# Patient Record
Sex: Female | Born: 1985 | Race: White | Hispanic: Yes | Marital: Single | Smoking: Never smoker
Health system: Southern US, Community
[De-identification: ages and names within clinical notes are randomized; demographics above are authoritative.]

## PROBLEM LIST (undated history)

## (undated) DIAGNOSIS — T7840XA Allergy, unspecified, initial encounter: Secondary | ICD-10-CM

## (undated) DIAGNOSIS — N921 Excessive and frequent menstruation with irregular cycle: Secondary | ICD-10-CM

## (undated) DIAGNOSIS — F329 Major depressive disorder, single episode, unspecified: Secondary | ICD-10-CM

## (undated) DIAGNOSIS — K589 Irritable bowel syndrome without diarrhea: Secondary | ICD-10-CM

## (undated) DIAGNOSIS — D649 Anemia, unspecified: Secondary | ICD-10-CM

## (undated) DIAGNOSIS — Z803 Family history of malignant neoplasm of breast: Secondary | ICD-10-CM

## (undated) DIAGNOSIS — K219 Gastro-esophageal reflux disease without esophagitis: Secondary | ICD-10-CM

## (undated) DIAGNOSIS — F4310 Post-traumatic stress disorder, unspecified: Secondary | ICD-10-CM

## (undated) DIAGNOSIS — F419 Anxiety disorder, unspecified: Secondary | ICD-10-CM

## (undated) DIAGNOSIS — F32A Depression, unspecified: Secondary | ICD-10-CM

## (undated) DIAGNOSIS — I10 Essential (primary) hypertension: Secondary | ICD-10-CM

## (undated) HISTORY — DX: Anemia, unspecified: D64.9

## (undated) HISTORY — DX: Allergy, unspecified, initial encounter: T78.40XA

## (undated) HISTORY — DX: Post-traumatic stress disorder, unspecified: F43.10

## (undated) HISTORY — DX: Excessive and frequent menstruation with irregular cycle: N92.1

## (undated) HISTORY — DX: Family history of malignant neoplasm of breast: Z80.3

## (undated) HISTORY — DX: Anxiety disorder, unspecified: F41.9

## (undated) HISTORY — DX: Depression, unspecified: F32.A

## (undated) HISTORY — DX: Major depressive disorder, single episode, unspecified: F32.9

## (undated) HISTORY — PX: NO PAST SURGERIES: SHX2092

## (undated) HISTORY — DX: Gastro-esophageal reflux disease without esophagitis: K21.9

## (undated) HISTORY — DX: Essential (primary) hypertension: I10

---
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
## 2004-03-31 ENCOUNTER — Emergency Department (HOSPITAL_COMMUNITY): Admission: EM | Admit: 2004-03-31 | Discharge: 2004-03-31 | Payer: Self-pay | Admitting: Emergency Medicine

## 2004-04-09 ENCOUNTER — Ambulatory Visit: Payer: Self-pay | Admitting: Internal Medicine

## 2004-04-29 ENCOUNTER — Ambulatory Visit: Payer: Self-pay | Admitting: Internal Medicine

## 2004-07-08 ENCOUNTER — Ambulatory Visit: Payer: Self-pay | Admitting: Internal Medicine

## 2004-09-10 ENCOUNTER — Ambulatory Visit (HOSPITAL_COMMUNITY): Admission: RE | Admit: 2004-09-10 | Discharge: 2004-09-10 | Payer: Self-pay | Admitting: Obstetrics and Gynecology

## 2005-02-11 ENCOUNTER — Inpatient Hospital Stay (HOSPITAL_COMMUNITY): Admission: AD | Admit: 2005-02-11 | Discharge: 2005-02-13 | Payer: Self-pay | Admitting: Obstetrics and Gynecology

## 2005-03-27 ENCOUNTER — Other Ambulatory Visit: Admission: RE | Admit: 2005-03-27 | Discharge: 2005-03-27 | Payer: Self-pay | Admitting: Obstetrics and Gynecology

## 2006-02-25 ENCOUNTER — Other Ambulatory Visit: Admission: RE | Admit: 2006-02-25 | Discharge: 2006-02-25 | Payer: Self-pay | Admitting: Obstetrics and Gynecology

## 2006-07-31 ENCOUNTER — Emergency Department (HOSPITAL_COMMUNITY): Admission: EM | Admit: 2006-07-31 | Discharge: 2006-07-31 | Payer: Self-pay | Admitting: Emergency Medicine

## 2007-04-27 ENCOUNTER — Emergency Department (HOSPITAL_COMMUNITY): Admission: EM | Admit: 2007-04-27 | Discharge: 2007-04-27 | Payer: Self-pay | Admitting: Emergency Medicine

## 2007-06-09 ENCOUNTER — Emergency Department (HOSPITAL_COMMUNITY): Admission: EM | Admit: 2007-06-09 | Discharge: 2007-06-09 | Payer: Self-pay | Admitting: Emergency Medicine

## 2007-07-20 ENCOUNTER — Ambulatory Visit: Payer: Self-pay | Admitting: Gastroenterology

## 2007-07-20 DIAGNOSIS — R1011 Right upper quadrant pain: Secondary | ICD-10-CM | POA: Insufficient documentation

## 2008-02-26 ENCOUNTER — Emergency Department (HOSPITAL_COMMUNITY): Admission: EM | Admit: 2008-02-26 | Discharge: 2008-02-26 | Payer: Self-pay | Admitting: Emergency Medicine

## 2008-09-12 ENCOUNTER — Ambulatory Visit: Payer: Self-pay | Admitting: Gastroenterology

## 2008-09-12 DIAGNOSIS — K3189 Other diseases of stomach and duodenum: Secondary | ICD-10-CM | POA: Insufficient documentation

## 2008-09-12 DIAGNOSIS — R1013 Epigastric pain: Secondary | ICD-10-CM

## 2008-09-18 ENCOUNTER — Ambulatory Visit: Payer: Self-pay | Admitting: Gastroenterology

## 2008-10-23 ENCOUNTER — Ambulatory Visit: Payer: Self-pay | Admitting: Gastroenterology

## 2008-10-23 DIAGNOSIS — R110 Nausea: Secondary | ICD-10-CM | POA: Insufficient documentation

## 2008-11-09 ENCOUNTER — Encounter: Admission: RE | Admit: 2008-11-09 | Discharge: 2008-11-09 | Payer: Self-pay | Admitting: Family Medicine

## 2008-11-27 ENCOUNTER — Ambulatory Visit (HOSPITAL_COMMUNITY): Admission: RE | Admit: 2008-11-27 | Discharge: 2008-11-27 | Payer: Self-pay | Admitting: Gastroenterology

## 2009-07-29 ENCOUNTER — Encounter: Admission: RE | Admit: 2009-07-29 | Discharge: 2009-07-29 | Payer: Self-pay | Admitting: Family Medicine

## 2010-09-09 NOTE — Assessment & Plan Note (Signed)
[facility]                         GASTROENTEROLOGY OFFICE NOTE   TENIOLA, TSENG                         MRN:          [id number]  DATE:07/20/2007                            DOB:          09-Feb-1986    REFERRING PHYSICIAN:  Clyda Greener, Dr.   Jaquita Rector FOR CONSULTATION:  Abdominal pain.   Ms. Toda is a 25 year old female, referred through the courtesy of Dr.  Bruna Potter for evaluation.  On two occasions over the past three months, she  has had episodes of severe right upper quadrant pain.  The pain has  radiated to her back and lasted several hours.  She was seen twice in  the emergency room, where workup, including ultrasound and blood work,  including LFTs, were normal.  The pain subsided spontaneously.  In  between, she has felt well, except for episodes of diarrhea and nausea,  which she attributes to her IBS.  She tends to fluctuate between  constipation and diarrhea.  At times, she will have nausea, associated  with her diarrhea.  She does take minocycline at any time during the day  for acne.  She is on no gastric irritants, including nonsteroidals.  She  apparently underwent upper endoscopy and colonoscopy in 2007 that were  normal.   PAST MEDICAL HISTORY:  Pertinent for depression.   FAMILY HISTORY:  Noncontributory.   MEDICATIONS:  Include minocycline and Loestrin.   She has no allergies.   She neither smokes nor drinks.  She is single.   REVIEW OF SYSTEMS:  Positive for back pain and menstrual cramps.   PHYSICAL EXAMINATION:  Pulse 60, blood pressure 104/58, weight 142.  HEENT: EOMI.  PERRLA.  Sclerae are anicteric.  Conjunctivae are pink.  NECK:  Supple without thyromegaly, adenopathy or carotid bruits.  CHEST:  Clear to auscultation and percussion without adventitious  sounds.  CARDIAC:  Regular rhythm; normal S1 S2.  There are no murmurs, gallops  or rubs.  ABDOMEN:  Bowel sounds are normoactive.  Abdomen is soft, nontender  and  nondistended.  There are no abdominal masses, tenderness, splenic  enlargement or hepatomegaly.  EXTREMITIES:  Full range of motion.  No cyanosis, clubbing or edema.  RECTAL:  Deferred.   IMPRESSION:  1. Intermittent right upper quadrant pain.  There is no evidence for      biliary tract disease.  This may be due to a reaction to      minocycline, particularly when she takes it on an empty stomach.      This would be an unusual presentation of reactive peptic disease.      I do not think this pain is from her IBS.  2. Irritable bowel syndrome/alternating constipation and diarrhea.   RECOMMENDATION:  1. Fiber supplementation.  2. NuLev 0.25 mg p.r.n. pain.     Barbette Hair. Arlyce Dice, MD,FACG  Electronically Signed    RDK/MedQ  DD: 07/20/2007  DT: 07/20/2007  Job #: [id number]   cc:   Clyda Greener, MD

## 2010-09-09 NOTE — Letter (Signed)
July 20, 2007    Dr. Clyda Greener  [address]  [place], [place] [address]   RE:  Patricia Whitehead, Patricia Whitehead  MRN:  [id number]  /  DOB:  1985/07/28   Dear Dr. Bruna Potter:   Upon your kind referral, I had the pleasure of evaluating your patient  and I am pleased to offer my findings.  I saw Patricia Whitehead in the office  today.  Enclosed is a copy of my progress note that details my findings  and recommendations.   Thank you for the opportunity to participate in your patient's care.    Sincerely,      Barbette Hair. Arlyce Dice, MD,FACG  Electronically Signed    RDK/MedQ  DD: 07/20/2007  DT: 07/20/2007  Job #: [id number]

## 2010-09-09 NOTE — Letter (Signed)
July 20, 2007    Ms. Patricia Whitehead   RE:  Patricia Whitehead, Patricia Whitehead  MRN:  [id number]  /  DOB:  1986-03-04   Dear Ms. Sami:   It is my pleasure to have treated you recently as a new patient in my  office.  I appreciate your confidence and the opportunity to participate  in your care.   Since I do have a busy inpatient endoscopy schedule and office schedule,  my office hours vary weekly.  I am, however, available for emergency  calls every day through my office.  If I cannot promptly meet an urgent  office appointment, another one of our gastroenterologists will be able  to assist you.   My well-trained staff are prepared to help you at all times.  For  emergencies after office hours, a physician from our gastroenterology  section is always available through my 24-hour answering service.   While you are under my care, I encourage discussion of your questions  and concerns, and I will be happy to return your calls as soon as I am  available.   Once again, I welcome you as a new patient and I look forward to a happy  and healthy relationship.    Sincerely,      Barbette Hair. Arlyce Dice, MD,FACG  Electronically Signed   RDK/MedQ  DD: 07/20/2007  DT: 07/20/2007  Job #: [id number]

## 2010-09-12 NOTE — H&P (Signed)
NAME:  Patricia Whitehead, Patricia Whitehead                ACCOUNT NO.:  [id number]   MEDICAL RECORD NO.:  [id number]          PATIENT TYPE:  MAT   LOCATION:  [facility]                          FACILITY:  [facility]   PHYSICIAN:  Crist Fat. Rivard, M.D. DATE OF BIRTH:  [birth year]   DATE OF ADMISSION:  02/11/2005  DATE OF DISCHARGE:                                HISTORY & PHYSICAL   Patricia Whitehead is a 25 year old, gravida 1, para 0, at 39-5/7th weeks, EDD  February 13, 2005, who presents with contractions becoming stronger and more  regular since 0700 this morning.  She reports positive fetal movement, no  vaginal bleeding, no rupture of membranes.  She denies any headache, visual  changes, or epigastric pain.   Her pregnancy has been followed by the C.N.M. service at [facility] and is  remarkable for:  1.  First trimester spotting.  2.  Depression.  3.  Group B strep negative.   This patient began prenatal care at the office of [facility], on July 18, 2004,  at approximately 10 weeks' gestation.  EDC determined by dates and confirmed  with ultrasound.  Her pregnancy has been essentially unremarkable.  She had  some nausea and vomiting relieved with Phenergan early in pregnancy.  She  had a small buttock abscess that was drained and treated with antibiotics  effectively at approximately 26/27 weeks pregnancy.  The remainder of her  pregnancy has been normal.  She has been size equal to dates throughout,  normotensive with no proteinuria.   PRENATAL LABORATORY DATA:  On July 18, 2004, hemoglobin and hematocrit 13.4  and 39.4, platelets 337,000.  Blood type and rhythm O positive.  Antibody  screen negative.  Sickle cell trait negative.  VDRL nonreactive.  Rubella  immune.  Hepatitis B surface antigen negative.  HIV nonreactive.  Pap smear  within normal limits.  GC and Chlamydia negative.  CF testing negative.  Quad screen within normal limits.  At 28 weeks, one-hour glucose challenge  within normal limits.  Hemoglobin 10.8.  At  36 weeks, culture of the vaginal  tract is negative for Group B strep, GC, and Chlamydia.   The patient has no known drug allergies.   She denies the use of tobacco, alcohol, or illicit drugs.   OBSTETRICAL HISTORY:  The current pregnancy.   MEDICAL HISTORY:  Significant for depression.   FAMILY HISTORY:  Maternal grandmother, paternal grandfather, maternal  grandfather and paternal grandmother with chronic hypertension.  The  patient's mother with a history of varicose veins.  The patient reports  ___________ in several family members but does not name those family  members.   GENETIC HISTORY:  There is no family history of familial or chromosomal  disorders, children that died in infancy, or that were born with birth  defects.   REVIEW OF SYSTEMS:  There are no signs or symptoms suggestive of focal or  systemic disease.  The patient is 43 and 5 weeks' gestation.  She presents  in early labor.  BPP is 6 out of 8.   PHYSICAL EXAMINATION:  VITAL SIGNS:  Stable.  The  patient is afebrile.  HEENT:  Unremarkable.  HEART:  Regular.  ABDOMEN:  Gravid in its contour.  Uterine fundus is noted to extend 39-cm  above the level of the pubic symphysis.  Leopold maneuvers, cephalic  presentation and the estimated fetal weight is 7.5 to 8 pounds.  Baseline of  the fetal heart rate monitor is 140s to 150s with average long term  variability.  Accelerations are noted with several mild variable  decelerations.  The strip is reassuring but nonreactive.  BPP was obtained  and finds to have oligohydramnios with AFI 0.6-cm.  EXTREMITIES:  Show no pathologic edema.  DTRs are 1+ with no clonus.  There  is no calf tenderness bilaterally.  PELVIC:  Digital exam of the cervix finds it to be 3-cm, dilated, 90%  effaced, with a cephalic presenting part at a -1 station.   ASSESSMENT:  1.  Intrauterine pregnancy at term.  2.  Early labor.  3.  Oligohydramnios.   1.  Admit per Dr. Dois Davenport Rivard.  2.   Routine C.N.M. orders.  3.  Start Pitocin per load dose protocol.  4.  Amnio infusion as needed for variable decelerations.      Rica Koyanagi, C.N.M.      Crist Fat Rivard, M.D.  Electronically Signed    SDM/MEDQ  D:  02/11/2005  T:  02/11/2005  Job:  [id number]

## 2011-01-16 LAB — CBC
HCT: 38.4
Hemoglobin: 13.5
MCHC: 35.2
MCV: 91.3
Platelets: 258
RBC: 4.21
RDW: 12.8
WBC: 7.6

## 2011-01-16 LAB — DIFFERENTIAL
Basophils Absolute: 0
Basophils Relative: 0
Eosinophils Absolute: 0
Eosinophils Relative: 0
Lymphocytes Relative: 10 — ABNORMAL LOW
Lymphs Abs: 0.7
Monocytes Absolute: 0.7
Monocytes Relative: 10
Neutro Abs: 6
Neutrophils Relative %: 80 — ABNORMAL HIGH

## 2011-01-16 LAB — COMPREHENSIVE METABOLIC PANEL
ALT: 16
AST: 17
Albumin: 4.4
Alkaline Phosphatase: 69
BUN: 7
CO2: 29
Calcium: 9.8
Chloride: 103
Creatinine, Ser: 0.62
GFR calc Af Amer: >60
GFR calc non Af Amer: >60
Glucose, Bld: 96
Potassium: 3.5
Sodium: 140
Total Bilirubin: 0.6
Total Protein: 8

## 2011-01-16 LAB — URINALYSIS, ROUTINE W REFLEX MICROSCOPIC
Bilirubin Urine: NEGATIVE
Glucose, UA: NEGATIVE
Ketones, ur: NEGATIVE
Leukocytes, UA: NEGATIVE
Nitrite: NEGATIVE
Protein, ur: NEGATIVE
Specific Gravity, Urine: 1.009
Urobilinogen, UA: 0.2
pH: 7

## 2011-01-16 LAB — URINE MICROSCOPIC-ADD ON

## 2011-01-16 LAB — PREGNANCY, URINE: Preg Test, Ur: NEGATIVE

## 2011-01-16 LAB — LIPASE, BLOOD: Lipase: 22

## 2011-01-27 LAB — POCT I-STAT, CHEM 8
BUN: 13
Calcium, Ion: 1.2
Chloride: 108
Creatinine, Ser: 0.7
Glucose, Bld: 97
HCT: 37
Hemoglobin: 12.6
Potassium: 3.2 — ABNORMAL LOW
Sodium: 142
TCO2: 24

## 2011-01-30 LAB — COMPREHENSIVE METABOLIC PANEL
ALT: 23
AST: 28
Albumin: 3.9
Alkaline Phosphatase: 62
BUN: 19
CO2: 27
Calcium: 9.4
Chloride: 105
Creatinine, Ser: 1.03
GFR calc Af Amer: >60
GFR calc non Af Amer: >60
Glucose, Bld: 117 — ABNORMAL HIGH
Potassium: 3.7
Sodium: 141
Total Bilirubin: 0.8
Total Protein: 7.7

## 2011-01-30 LAB — URINE MICROSCOPIC-ADD ON

## 2011-01-30 LAB — URINALYSIS, ROUTINE W REFLEX MICROSCOPIC
Bilirubin Urine: NEGATIVE
Glucose, UA: NEGATIVE
Hgb urine dipstick: NEGATIVE
Ketones, ur: 15 — AB
Nitrite: NEGATIVE
Protein, ur: 100 — AB
Specific Gravity, Urine: 1.037 — ABNORMAL HIGH
Urobilinogen, UA: 1
pH: 8.5 — ABNORMAL HIGH

## 2011-01-30 LAB — DIFFERENTIAL
Basophils Absolute: 0
Basophils Relative: 0
Eosinophils Absolute: 0
Eosinophils Relative: 0
Lymphocytes Relative: 6 — ABNORMAL LOW
Lymphs Abs: 1
Monocytes Absolute: 0.8
Monocytes Relative: 5
Neutro Abs: 14.7 — ABNORMAL HIGH
Neutrophils Relative %: 89 — ABNORMAL HIGH

## 2011-01-30 LAB — CBC
HCT: 38.2
Hemoglobin: 13.6
MCHC: 35.5
MCV: 89.7
Platelets: 268
RBC: 4.26
RDW: 12.5
WBC: 16.6 — ABNORMAL HIGH

## 2011-01-30 LAB — LIPASE, BLOOD: Lipase: 33

## 2011-11-04 ENCOUNTER — Emergency Department (HOSPITAL_COMMUNITY)
Admission: EM | Admit: 2011-11-04 | Discharge: 2011-11-04 | Disposition: A | Discharge status: Home / Self Care | Payer: Medicaid Other | Attending: Emergency Medicine | Admitting: Emergency Medicine

## 2011-11-04 ENCOUNTER — Encounter (HOSPITAL_COMMUNITY): Payer: Self-pay | Admitting: *Deleted

## 2011-11-04 DIAGNOSIS — T4275XA Adverse effect of unspecified antiepileptic and sedative-hypnotic drugs, initial encounter: Secondary | ICD-10-CM | POA: Insufficient documentation

## 2011-11-04 DIAGNOSIS — T7840XA Allergy, unspecified, initial encounter: Secondary | ICD-10-CM

## 2011-11-04 DIAGNOSIS — Z888 Allergy status to other drugs, medicaments and biological substances status: Secondary | ICD-10-CM | POA: Insufficient documentation

## 2011-11-04 LAB — URINALYSIS, ROUTINE W REFLEX MICROSCOPIC
Bilirubin Urine: NEGATIVE
Glucose, UA: NEGATIVE mg/dL
Hgb urine dipstick: NEGATIVE
Ketones, ur: NEGATIVE mg/dL
Nitrite: NEGATIVE
Protein, ur: NEGATIVE mg/dL
Specific Gravity, Urine: 1.011 (ref 1.005–1.030)
Urobilinogen, UA: 0.2 mg/dL (ref 0.0–1.0)
pH: 8 (ref 5.0–8.0)

## 2011-11-04 LAB — PREGNANCY, URINE: Preg Test, Ur: NEGATIVE

## 2011-11-04 LAB — URINE MICROSCOPIC-ADD ON

## 2011-11-04 LAB — RAPID URINE DRUG SCREEN, HOSP PERFORMED
Amphetamines: NOT DETECTED
Barbiturates: NOT DETECTED
Benzodiazepines: NOT DETECTED
Cocaine: NOT DETECTED
Opiates: NOT DETECTED
Tetrahydrocannabinol: NOT DETECTED

## 2011-11-04 MED ORDER — FAMOTIDINE IN NACL 20-0.9 MG/50ML-% IV SOLN
20.0000 mg | Freq: Once | INTRAVENOUS | Status: AC
  Administered 2011-11-04: 20 mg via INTRAVENOUS
  Filled 2011-11-04: qty 50

## 2011-11-04 MED ORDER — DIPHENHYDRAMINE HCL 50 MG/ML IJ SOLN
25.0000 mg | Freq: Once | INTRAMUSCULAR | Status: AC
  Administered 2011-11-04: 25 mg via INTRAVENOUS
  Filled 2011-11-04: qty 1

## 2011-11-04 MED ORDER — DIPHENHYDRAMINE HCL 25 MG PO TABS: 25.0000 mg | ORAL_TABLET | Freq: Two times a day (BID) | ORAL | Status: DC

## 2011-11-04 MED ORDER — ONDANSETRON HCL 4 MG/2ML IJ SOLN
4.0000 mg | Freq: Once | INTRAMUSCULAR | Status: AC
  Administered 2011-11-04: 4 mg via INTRAVENOUS
  Filled 2011-11-04: qty 2

## 2011-11-04 MED ORDER — SODIUM CHLORIDE 0.9 % IV SOLN
Freq: Once | INTRAVENOUS | Status: AC
  Administered 2011-11-04: 03:00:00 via INTRAVENOUS

## 2011-11-04 MED ORDER — METHYLPREDNISOLONE SODIUM SUCC 40 MG IJ SOLR
40.0000 mg | Freq: Once | INTRAMUSCULAR | Status: AC
  Administered 2011-11-04: 40 mg via INTRAVENOUS
  Filled 2011-11-04: qty 1

## 2011-11-04 MED ORDER — BENZTROPINE MESYLATE 1 MG/ML IJ SOLN
2.0000 mg | Freq: Once | INTRAMUSCULAR | Status: AC
  Administered 2011-11-04: 2 mg via INTRAVENOUS
  Filled 2011-11-04: qty 2

## 2011-11-04 NOTE — Discharge Instructions (Signed)
Please do not take another persons medication  Take Benadryl 25-50 milligram twice a day for the next 7 days

## 2011-11-04 NOTE — ED Notes (Signed)
Pt took med for migraine that belonged to her mom.  Took two hours ago and when she woke up she felt like her limbs were heavy and numb; states now she feels numb all over her face and chest.  Pt hyperventilating on arrival.  Topamax 25 mg

## 2011-11-04 NOTE — ED Provider Notes (Signed)
Pt presents with acute onset of stiffness and agitation after taking medication of mothers.  PE:  Pt has stiffness of hands and neck, agitated, uncomfortable.  No tachycardia, normal pusles, dry skin  Assessment:  Dystonic reaction to Topamax, treated with cogentin, benadryl, total improvement, educated on not taking other peoples prescriptions and treatment should her sx return.  Medical screening examination/treatment/procedure(s) were conducted as a shared visit with non-physician practitioner(s) and myself.  I personally evaluated the patient during the encounter    Vida Roller, MD 11/04/11 [phone]

## 2011-11-04 NOTE — ED Provider Notes (Signed)
History     CSN: [id number]  Arrival date & time 11/04/11  0254   None     Chief Complaint  Patient presents with  . Allergic Reaction    (Consider location/radiation/quality/duration/timing/severity/associated sxs/prior treatment) HPI Comments: Patient took Mother's Topamax about 30 minutes later developed generalized body cramping anxirty and an internal burning sensation   Patient is a 26 y.o. female presenting with allergic reaction. The history is provided by the patient.  Allergic Reaction The primary symptoms do not include wheezing, abdominal pain, nausea, vomiting, diarrhea, dizziness, palpitations, altered mental status, rash or urticaria. The current episode started 3 to 5 hours ago. The problem has not changed since onset.   History reviewed. No pertinent past medical history.  History reviewed. No pertinent past surgical history.  No family history on file.  History  Substance Use Topics  . Smoking status: Never Smoker   . Smokeless tobacco: Not on file  . Alcohol Use:     OB History    Grav Para Term Preterm Abortions TAB SAB Ect Mult Living                  Review of Systems  Constitutional: Negative for fever and chills.  Respiratory: Negative for wheezing.   Cardiovascular: Negative for palpitations.  Gastrointestinal: Negative for nausea, vomiting, abdominal pain and diarrhea.  Musculoskeletal: Negative for joint swelling.  Skin: Negative for pallor and rash.  Neurological: Positive for numbness. Negative for dizziness.  Psychiatric/Behavioral: Positive for agitation. Negative for altered mental status.    Allergies  Ketamine and Latex  Home Medications   Current Outpatient Rx  Name Route Sig Dispense Refill  . NORETHIN ACE-ETH ESTRAD-FE 1-20 MG-MCG PO TABS Oral Take 1 tablet by mouth daily.    Marland Kitchen DIPHENHYDRAMINE HCL 25 MG PO TABS Oral Take 1 tablet (25 mg total) by mouth 2 (two) times daily. 20 tablet 0    BP 145/75  Pulse 62  Temp  97.5 F (36.4 C)  Resp 18  SpO2 100%  LMP 10/21/2011  Physical Exam  Constitutional: She appears well-developed and well-nourished.  HENT:  Head: Normocephalic.       Having difficulty opening mouth   Neck: Normal range of motion.  Cardiovascular: Normal rate.   Pulmonary/Chest: Effort normal and breath sounds normal. No respiratory distress. She exhibits no tenderness.  Abdominal: Soft.  Musculoskeletal: She exhibits tenderness.       Cramping of fingers and feet   Neurological: She is alert.  Skin: Skin is warm and dry. No rash noted.    ED Course  Procedures (including critical care time)  Labs Reviewed  URINALYSIS, ROUTINE W REFLEX MICROSCOPIC - Abnormal; Notable for the following:    APPearance CLOUDY (*)     Leukocytes, UA TRACE (*)     All other components within normal limits  URINE MICROSCOPIC-ADD ON - Abnormal; Notable for the following:    Squamous Epithelial / LPF FEW (*)     Bacteria, UA MANY (*)     All other components within normal limits  PREGNANCY, URINE  URINE RAPID DRUG SCREEN (HOSP PERFORMED)   No results found.   1. Allergic reaction caused by a drug       MDM   distonic reaction  Given Benadryl and Cogentin  30 minutes after Cogentin patient feeling better able to flex fingers and toes       Arman Filter, NP 11/04/11 0457  Arman Filter, NP 11/04/11 [phone]

## 2011-11-04 NOTE — ED Notes (Signed)
Family states pt took her mother's topamax this evening and afterward started complaining her hands and feet became tingly then the pt states it moved to her chest  Pt's hand contracted upon examination  Pt wiggling around on stretcher states she is tingling all over  O2 applied, encouraged slow and steady breathing

## 2012-06-27 ENCOUNTER — Emergency Department (HOSPITAL_COMMUNITY)
Admission: EM | Admit: 2012-06-27 | Discharge: 2012-06-27 | Disposition: A | Discharge status: Home / Self Care | Payer: BC Managed Care – PPO | Attending: Emergency Medicine | Admitting: Emergency Medicine

## 2012-06-27 ENCOUNTER — Encounter (HOSPITAL_COMMUNITY): Payer: Self-pay | Admitting: *Deleted

## 2012-06-27 DIAGNOSIS — Z79899 Other long term (current) drug therapy: Secondary | ICD-10-CM | POA: Insufficient documentation

## 2012-06-27 DIAGNOSIS — N390 Urinary tract infection, site not specified: Secondary | ICD-10-CM | POA: Insufficient documentation

## 2012-06-27 DIAGNOSIS — Z87442 Personal history of urinary calculi: Secondary | ICD-10-CM | POA: Insufficient documentation

## 2012-06-27 DIAGNOSIS — R319 Hematuria, unspecified: Secondary | ICD-10-CM | POA: Insufficient documentation

## 2012-06-27 DIAGNOSIS — Z8719 Personal history of other diseases of the digestive system: Secondary | ICD-10-CM | POA: Insufficient documentation

## 2012-06-27 HISTORY — DX: Irritable bowel syndrome, unspecified: K58.9

## 2012-06-27 LAB — URINE MICROSCOPIC-ADD ON

## 2012-06-27 LAB — URINALYSIS, ROUTINE W REFLEX MICROSCOPIC
Bilirubin Urine: NEGATIVE
Glucose, UA: NEGATIVE mg/dL
Ketones, ur: NEGATIVE mg/dL
Nitrite: POSITIVE — AB
Protein, ur: 30 mg/dL — AB
Specific Gravity, Urine: 1.027 (ref 1.005–1.030)
Urobilinogen, UA: 1 mg/dL (ref 0.0–1.0)
pH: 8 (ref 5.0–8.0)

## 2012-06-27 LAB — POCT PREGNANCY, URINE: Preg Test, Ur: NEGATIVE

## 2012-06-27 MED ORDER — CEFTRIAXONE SODIUM 1 G IJ SOLR
1.0000 g | Freq: Once | INTRAMUSCULAR | Status: AC
  Administered 2012-06-27: 1 g via INTRAMUSCULAR
  Filled 2012-06-27: qty 10

## 2012-06-27 MED ORDER — OXYCODONE-ACETAMINOPHEN 5-325 MG PO TABS
1.0000 | ORAL_TABLET | Freq: Once | ORAL | Status: AC
  Administered 2012-06-27: 1 via ORAL
  Filled 2012-06-27: qty 1

## 2012-06-27 MED ORDER — HYDROCODONE-ACETAMINOPHEN 5-325 MG PO TABS: 1.0000 | ORAL_TABLET | ORAL | Status: DC | PRN

## 2012-06-27 MED ORDER — CEPHALEXIN 500 MG PO CAPS: 500.0000 mg | ORAL_CAPSULE | Freq: Four times a day (QID) | ORAL | Status: DC

## 2012-06-27 MED ORDER — PHENAZOPYRIDINE HCL 200 MG PO TABS: 200.0000 mg | ORAL_TABLET | Freq: Three times a day (TID) | ORAL | Status: DC

## 2012-06-27 MED ORDER — PHENAZOPYRIDINE HCL 200 MG PO TABS
200.0000 mg | ORAL_TABLET | Freq: Once | ORAL | Status: AC
  Administered 2012-06-27: 200 mg via ORAL
  Filled 2012-06-27: qty 1

## 2012-06-27 MED ORDER — FLUCONAZOLE 200 MG PO TABS: 200.0000 mg | ORAL_TABLET | Freq: Every day | ORAL | Status: AC

## 2012-06-27 MED ORDER — LIDOCAINE HCL 1 % IJ SOLN
INTRAMUSCULAR | Status: AC
  Administered 2012-06-27: 2.1 mL
  Filled 2012-06-27: qty 20

## 2012-06-27 NOTE — ED Notes (Signed)
Pt reports pelvic pain that began a few hours ago, pt also admits to urinary frequency and pressure - pt states she just finished her menstrual cycle yesterday and has noted some blood when urinating.

## 2012-06-27 NOTE — ED Provider Notes (Signed)
History     CSN: [id number]  Arrival date & time 06/27/12  2024   First MD Initiated Contact with Patient 06/27/12 2139      Chief Complaint  Patient presents with  . Abdominal Pain     The history is provided by the patient.   patient reports several hours of New Year's Mary hesitancy and pelvic pressure.  No vaginal complaints.  No recent antibiotics.  No fevers or chills.  No flank pain.  No nausea or vomiting.  She reports mild lower abdominal cramping and some hematuria.  No radiating flank pain.  History kidney stones.  Symptoms are mild to moderate in severity.  Nothing worsens or improves her symptoms  Past Medical History  Diagnosis Date  . IBS (irritable bowel syndrome)     History reviewed. No pertinent past surgical history.  History reviewed. No pertinent family history.  History  Substance Use Topics  . Smoking status: Never Smoker   . Smokeless tobacco: Not on file  . Alcohol Use: No    OB History   Grav Para Term Preterm Abortions TAB SAB Ect Mult Living                  Review of Systems  Gastrointestinal: Positive for abdominal pain.  All other systems reviewed and are negative.    Allergies  Ketamine and Latex  Home Medications   Current Outpatient Rx  Name  Route  Sig  Dispense  Refill  . ibuprofen (ADVIL,MOTRIN) 200 MG tablet   Oral   Take 400 mg by mouth once.         . norethindrone-ethinyl estradiol (JUNEL FE,GILDESS FE,LOESTRIN FE) 1-20 MG-MCG tablet   Oral   Take 1 tablet by mouth daily.         . phenazopyridine (AZO-DINE URINARY ANALGESIC) 97 MG tablet   Oral   Take 97 mg by mouth once.         . cephALEXin (KEFLEX) 500 MG capsule   Oral   Take 1 capsule (500 mg total) by mouth 4 (four) times daily.   28 capsule   0   . HYDROcodone-acetaminophen (NORCO/VICODIN) 5-325 MG per tablet   Oral   Take 1 tablet by mouth every 4 (four) hours as needed for pain.   15 tablet   0   . phenazopyridine (PYRIDIUM) 200 MG  tablet   Oral   Take 1 tablet (200 mg total) by mouth 3 (three) times daily.   6 tablet   0     BP 135/76  Pulse 70  Temp(Src) 97.9 F (36.6 C) (Oral)  Resp 19  SpO2 100%  LMP 06/22/2012  Physical Exam  Nursing note and vitals reviewed. Constitutional: She is oriented to person, place, and time. She appears well-developed and well-nourished. No distress.  HENT:  Head: Normocephalic and atraumatic.  Eyes: EOM are normal.  Neck: Normal range of motion.  Cardiovascular: Normal rate, regular rhythm and normal heart sounds.   Pulmonary/Chest: Effort normal and breath sounds normal.  Abdominal: Soft. She exhibits no distension. There is no tenderness.  Musculoskeletal: Normal range of motion.  Neurological: She is alert and oriented to person, place, and time.  Skin: Skin is warm and dry.  Psychiatric: She has a normal mood and affect. Judgment normal.    ED Course  Procedures (including critical care time)  Labs Reviewed  URINALYSIS, ROUTINE W REFLEX MICROSCOPIC - Abnormal; Notable for the following:    Color, Urine ORANGE (*)  APPearance CLOUDY (*)    Hgb urine dipstick LARGE (*)    Protein, ur 30 (*)    Nitrite POSITIVE (*)    Leukocytes, UA LARGE (*)    All other components within normal limits  URINE MICROSCOPIC-ADD ON - Abnormal; Notable for the following:    Bacteria, UA MANY (*)    All other components within normal limits  URINE CULTURE  POCT PREGNANCY, URINE   No results found.   1. Urinary tract infection       MDM  UTI without signs of pyelonephritis.  Peridium, pain medicine, IM Rocephin emergency apartment.  Home with antibiotics.  Understands return to the ER for new or worsening symptoms        Lyanne Co, MD 06/27/12 2233

## 2012-06-29 LAB — URINE CULTURE: Colony Count: 15000

## 2012-06-30 NOTE — ED Notes (Signed)
+   Urine Patient treated with Keflex-sensitive to same-chart appended per protocol MD. 

## 2012-07-29 ENCOUNTER — Encounter (HOSPITAL_COMMUNITY): Payer: Self-pay

## 2012-07-29 ENCOUNTER — Emergency Department (HOSPITAL_COMMUNITY)
Admission: EM | Admit: 2012-07-29 | Discharge: 2012-07-29 | Disposition: A | Discharge status: Home / Self Care | Payer: BC Managed Care – PPO | Attending: Emergency Medicine | Admitting: Emergency Medicine

## 2012-07-29 DIAGNOSIS — R350 Frequency of micturition: Secondary | ICD-10-CM | POA: Insufficient documentation

## 2012-07-29 DIAGNOSIS — K589 Irritable bowel syndrome without diarrhea: Secondary | ICD-10-CM | POA: Insufficient documentation

## 2012-07-29 DIAGNOSIS — Z79899 Other long term (current) drug therapy: Secondary | ICD-10-CM | POA: Insufficient documentation

## 2012-07-29 DIAGNOSIS — Z3202 Encounter for pregnancy test, result negative: Secondary | ICD-10-CM | POA: Insufficient documentation

## 2012-07-29 DIAGNOSIS — R300 Dysuria: Secondary | ICD-10-CM | POA: Insufficient documentation

## 2012-07-29 DIAGNOSIS — N390 Urinary tract infection, site not specified: Secondary | ICD-10-CM | POA: Insufficient documentation

## 2012-07-29 LAB — URINE MICROSCOPIC-ADD ON

## 2012-07-29 LAB — URINALYSIS, ROUTINE W REFLEX MICROSCOPIC
Glucose, UA: NEGATIVE mg/dL
Ketones, ur: 40 mg/dL — AB
Nitrite: POSITIVE — AB
Protein, ur: 100 mg/dL — AB
Specific Gravity, Urine: 1.026 (ref 1.005–1.030)
Urobilinogen, UA: >8 mg/dL — ABNORMAL HIGH (ref 0.0–1.0)
pH: 6 (ref 5.0–8.0)

## 2012-07-29 LAB — PREGNANCY, URINE: Preg Test, Ur: NEGATIVE

## 2012-07-29 MED ORDER — CEPHALEXIN 500 MG PO CAPS: 500.0000 mg | ORAL_CAPSULE | Freq: Four times a day (QID) | ORAL | Status: DC

## 2012-07-29 MED ORDER — HYDROCODONE-ACETAMINOPHEN 5-325 MG PO TABS
2.0000 | ORAL_TABLET | Freq: Once | ORAL | Status: AC
  Administered 2012-07-29: 2 via ORAL
  Filled 2012-07-29: qty 2

## 2012-07-29 MED ORDER — FLUCONAZOLE 150 MG PO TABS: 150.0000 mg | ORAL_TABLET | Freq: Once | ORAL | Status: DC

## 2012-07-29 MED ORDER — HYDROCODONE-ACETAMINOPHEN 5-325 MG PO TABS: 2.0000 | ORAL_TABLET | ORAL | Status: DC | PRN

## 2012-07-29 NOTE — ED Notes (Signed)
Per patient, she states that she was woken up by abdominal pain in the lower quadrants- felt like she needed to use the bathroom but only urinated a small amount- burning sensation during urination. Pt states that she took an OTC urinary pain relief which did not help. No blood in the urine noticed.

## 2012-07-29 NOTE — ED Provider Notes (Signed)
Medical screening examination/treatment/procedure(s) were performed by non-physician practitioner and as supervising physician I was immediately available for consultation/collaboration.  Ethelda Chick, MD 07/29/12 1034

## 2012-07-29 NOTE — ED Provider Notes (Signed)
History     CSN: [id number]  Arrival date & time 07/29/12  0908   First MD Initiated Contact with Patient 07/29/12 [phone]      Chief Complaint  Patient presents with  . Abdominal Pain  . Dysuria    (Consider location/radiation/quality/duration/timing/severity/associated sxs/prior treatment) Patient is a 27 y.o. female presenting with abdominal pain and dysuria. The history is provided by the patient. No language interpreter was used.  Abdominal Pain Pain location:  Generalized Pain quality: not aching   Pain radiates to:  Does not radiate Pain severity:  Moderate Onset quality:  Sudden Timing:  Constant Progression:  Worsening Chronicity:  New Relieved by:  Nothing Worsened by:  Nothing tried Ineffective treatments: urinary analgesic. Associated symptoms: dysuria   Dysuria  Associated symptoms include frequency.    Past Medical History  Diagnosis Date  . IBS (irritable bowel syndrome)     History reviewed. No pertinent past surgical history.  No family history on file.  History  Substance Use Topics  . Smoking status: Never Smoker   . Smokeless tobacco: Not on file  . Alcohol Use: No    OB History   Grav Para Term Preterm Abortions TAB SAB Ect Mult Living                  Review of Systems  Gastrointestinal: Positive for abdominal pain.  Genitourinary: Positive for dysuria and frequency.  All other systems reviewed and are negative.    Allergies  Ketamine and Latex  Home Medications   Current Outpatient Rx  Name  Route  Sig  Dispense  Refill  . norethindrone-ethinyl estradiol (MICROGESTIN) 1-20 MG-MCG tablet   Oral   Take 1 tablet by mouth at bedtime.           BP 143/98  Pulse 78  Temp(Src) 97.9 F (36.6 C) (Oral)  SpO2 99%  LMP 07/20/2012  Physical Exam  Nursing note and vitals reviewed. Constitutional: She is oriented to person, place, and time. She appears well-developed and well-nourished.  HENT:  Head: Normocephalic and  atraumatic.  Right Ear: External ear normal.  Left Ear: External ear normal.  Mouth/Throat: Oropharynx is clear and moist.  Eyes: Pupils are equal, round, and reactive to light.  Neck: Normal range of motion.  Cardiovascular: Normal rate and normal heart sounds.   Pulmonary/Chest: Effort normal.  Abdominal: Soft.  Musculoskeletal: Normal range of motion.  Neurological: She is alert and oriented to person, place, and time. She has normal reflexes.  Skin: Skin is warm.  Psychiatric: She has a normal mood and affect.    ED Course  Procedures (including critical care time)  Labs Reviewed  PREGNANCY, URINE  URINALYSIS, ROUTINE W REFLEX MICROSCOPIC   No results found.   No diagnosis found.    MDM  Urine shows tntc wbc. Rbc's and many bacteria   Pt given rx for keflex 500one po qid,   Hydrocodone for pain.    Pt advised to see her MD for recheck in 3-4 days    Elson Areas, [place] 07/29/12 1032

## 2012-07-31 LAB — URINE CULTURE: Colony Count: >=100000

## 2012-08-01 ENCOUNTER — Telehealth (HOSPITAL_COMMUNITY): Payer: Self-pay | Admitting: Emergency Medicine

## 2012-08-01 NOTE — ED Notes (Signed)
+  Urine. Patient treated with Keflex. Sensitive to same. Per protocol MD. °

## 2012-08-01 NOTE — ED Notes (Signed)
Patient has +Urine culture. °

## 2012-09-27 ENCOUNTER — Encounter (HOSPITAL_COMMUNITY): Payer: Self-pay | Admitting: *Deleted

## 2012-09-27 ENCOUNTER — Emergency Department (HOSPITAL_COMMUNITY)
Admission: EM | Admit: 2012-09-27 | Discharge: 2012-09-28 | Disposition: A | Discharge status: Home / Self Care | Payer: BC Managed Care – PPO | Attending: Emergency Medicine | Admitting: Emergency Medicine

## 2012-09-27 DIAGNOSIS — R350 Frequency of micturition: Secondary | ICD-10-CM | POA: Insufficient documentation

## 2012-09-27 DIAGNOSIS — Z87440 Personal history of urinary (tract) infections: Secondary | ICD-10-CM | POA: Insufficient documentation

## 2012-09-27 DIAGNOSIS — Z3202 Encounter for pregnancy test, result negative: Secondary | ICD-10-CM | POA: Insufficient documentation

## 2012-09-27 DIAGNOSIS — Z8719 Personal history of other diseases of the digestive system: Secondary | ICD-10-CM | POA: Insufficient documentation

## 2012-09-27 DIAGNOSIS — R300 Dysuria: Secondary | ICD-10-CM | POA: Insufficient documentation

## 2012-09-27 LAB — POCT PREGNANCY, URINE: Preg Test, Ur: NEGATIVE

## 2012-09-27 LAB — URINALYSIS, ROUTINE W REFLEX MICROSCOPIC
Bilirubin Urine: NEGATIVE
Glucose, UA: NEGATIVE mg/dL
Hgb urine dipstick: NEGATIVE
Ketones, ur: NEGATIVE mg/dL
Leukocytes, UA: NEGATIVE
Nitrite: NEGATIVE
Protein, ur: NEGATIVE mg/dL
Specific Gravity, Urine: 1.03 (ref 1.005–1.030)
Urobilinogen, UA: 1 mg/dL (ref 0.0–1.0)
pH: 7 (ref 5.0–8.0)

## 2012-09-27 NOTE — ED Notes (Signed)
Pt stated she has been off of the ABX for the past two weeks.

## 2012-09-27 NOTE — ED Notes (Signed)
PA student at bedside speaking to pt and family member.

## 2012-09-27 NOTE — ED Notes (Signed)
Pt in c/o pain with urination since yesterday

## 2012-09-28 LAB — GC/CHLAMYDIA PROBE AMP
CT Probe RNA: NEGATIVE
GC Probe RNA: NEGATIVE

## 2012-09-28 LAB — WET PREP, GENITAL
Trich, Wet Prep: NONE SEEN
Yeast Wet Prep HPF POC: NONE SEEN

## 2012-09-28 LAB — RPR: RPR Ser Ql: NONREACTIVE

## 2012-09-28 LAB — HIV ANTIBODY (ROUTINE TESTING W REFLEX): HIV: NONREACTIVE

## 2012-09-28 NOTE — ED Provider Notes (Signed)
History     CSN: [id number]  Arrival date & time 09/27/12  2131   First MD Initiated Contact with Patient 09/27/12 2309      Chief Complaint  Patient presents with  . Dysuria    (Consider location/radiation/quality/duration/timing/severity/associated sxs/prior treatment) HPI Comments: Patient with chronic dysuria, has appointment with Dr. Brunilda Payor in 3 weeks for a cyscopcy for further evaluation Finished antibiotics last week Also concerned about an STD as had a recent breakup with boyfriend.   Patient is a 27 y.o. female presenting with dysuria. The history is provided by the patient.  Dysuria Pain quality:  Unable to specify Onset quality:  Unable to specify Duration:  4 months Timing:  Constant Progression:  Unchanged Chronicity:  Chronic Recent urinary tract infections: yes   Relieved by:  Nothing Worsened by:  Nothing tried Ineffective treatments:  Antibiotics Associated symptoms: no abdominal pain, no fever, no flank pain, no genital lesions, no nausea, no vaginal discharge and no vomiting   Risk factors: recurrent urinary tract infections   Risk factors: no hx of pyelonephritis, no hx of urolithiasis and not pregnant     Past Medical History  Diagnosis Date  . IBS (irritable bowel syndrome)     History reviewed. No pertinent past surgical history.  History reviewed. No pertinent family history.  History  Substance Use Topics  . Smoking status: Never Smoker   . Smokeless tobacco: Not on file  . Alcohol Use: No    OB History   Grav Para Term Preterm Abortions TAB SAB Ect Mult Living                  Review of Systems  Constitutional: Negative for fever.  Gastrointestinal: Negative for nausea, vomiting, abdominal pain and diarrhea.  Genitourinary: Positive for dysuria and frequency. Negative for flank pain, decreased urine volume, vaginal discharge and vaginal pain.  Neurological: Negative for dizziness and numbness.  All other systems reviewed and are  negative.    Allergies  Ketamine and Latex  Home Medications   Current Outpatient Rx  Name  Route  Sig  Dispense  Refill  . norethindrone-ethinyl estradiol (MICROGESTIN) 1-20 MG-MCG tablet   Oral   Take 1 tablet by mouth at bedtime.           BP 150/79  Pulse 75  Temp(Src) 98.4 F (36.9 C) (Oral)  Resp 20  Ht 5' (1.524 m)  Wt 135 lb (61.236 kg)  BMI 26.37 kg/m2  SpO2 100%  Physical Exam  Constitutional: She appears well-developed and well-nourished.  HENT:  Head: Normocephalic.  Eyes: Pupils are equal, round, and reactive to light.  Neck: Normal range of motion.  Cardiovascular: Normal rate and regular rhythm.   Pulmonary/Chest: Effort normal.  Abdominal: Soft. Bowel sounds are normal.  Genitourinary: Vagina normal. Cervix exhibits no discharge. Right adnexum displays no tenderness. Left adnexum displays no tenderness. No vaginal discharge found.  Musculoskeletal: Normal range of motion.  Neurological: She is alert.  Skin: Skin is warm.    ED Course  Procedures (including critical care time)  Labs Reviewed  WET PREP, GENITAL - Abnormal; Notable for the following:    Clue Cells Wet Prep HPF POC RARE (*)    WBC, Wet Prep HPF POC MODERATE (*)    All other components within normal limits  URINALYSIS, ROUTINE W REFLEX MICROSCOPIC - Abnormal; Notable for the following:    APPearance CLOUDY (*)    All other components within normal limits  GC/CHLAMYDIA PROBE AMP  RPR  HIV ANTIBODY (ROUTINE TESTING)  POCT PREGNANCY, URINE   No results found.   1. Dysuria       MDM   Chronic dysuria patient will FU DR. Nesi         Arman Filter, NP 09/28/12 0145

## 2012-09-28 NOTE — ED Notes (Signed)
NP and PA student at bedside, completing pelvic exam

## 2012-09-29 NOTE — ED Provider Notes (Signed)
Medical screening examination/treatment/procedure(s) were performed by non-physician practitioner and as supervising physician I was immediately available for consultation/collaboration.  Kiarra Kidd, MD 09/29/12 0531 

## 2013-10-28 ENCOUNTER — Emergency Department (HOSPITAL_COMMUNITY)
Admission: EM | Admit: 2013-10-28 | Discharge: 2013-10-28 | Disposition: A | Discharge status: Home / Self Care | Payer: BC Managed Care – PPO | Attending: Emergency Medicine | Admitting: Emergency Medicine

## 2013-10-28 DIAGNOSIS — Z3202 Encounter for pregnancy test, result negative: Secondary | ICD-10-CM | POA: Insufficient documentation

## 2013-10-28 DIAGNOSIS — R1011 Right upper quadrant pain: Secondary | ICD-10-CM | POA: Insufficient documentation

## 2013-10-28 DIAGNOSIS — R1013 Epigastric pain: Secondary | ICD-10-CM | POA: Insufficient documentation

## 2013-10-28 DIAGNOSIS — Z91040 Latex allergy status: Secondary | ICD-10-CM | POA: Insufficient documentation

## 2013-10-28 DIAGNOSIS — Z8719 Personal history of other diseases of the digestive system: Secondary | ICD-10-CM | POA: Insufficient documentation

## 2013-10-28 DIAGNOSIS — R112 Nausea with vomiting, unspecified: Secondary | ICD-10-CM | POA: Insufficient documentation

## 2013-10-28 DIAGNOSIS — R1033 Periumbilical pain: Secondary | ICD-10-CM | POA: Insufficient documentation

## 2013-10-28 LAB — URINALYSIS, ROUTINE W REFLEX MICROSCOPIC
Bilirubin Urine: NEGATIVE
Glucose, UA: NEGATIVE mg/dL
Ketones, ur: NEGATIVE mg/dL
Nitrite: NEGATIVE
Protein, ur: NEGATIVE mg/dL
Specific Gravity, Urine: 1.019 (ref 1.005–1.030)
Urobilinogen, UA: 0.2 mg/dL (ref 0.0–1.0)
pH: 7.5 (ref 5.0–8.0)

## 2013-10-28 LAB — COMPREHENSIVE METABOLIC PANEL
ALT: 18 U/L (ref 0–35)
AST: 42 U/L — ABNORMAL HIGH (ref 0–37)
Albumin: 4.6 g/dL (ref 3.5–5.2)
Alkaline Phosphatase: 69 U/L (ref 39–117)
Anion gap: 14 (ref 5–15)
BUN: 13 mg/dL (ref 6–23)
CO2: 26 mEq/L (ref 19–32)
Calcium: 9.8 mg/dL (ref 8.4–10.5)
Chloride: 101 mEq/L (ref 96–112)
Creatinine, Ser: 0.61 mg/dL (ref 0.50–1.10)
GFR calc Af Amer: >90 mL/min (ref >90)
GFR calc non Af Amer: >90 mL/min (ref >90)
Glucose, Bld: 124 mg/dL — ABNORMAL HIGH (ref 70–99)
Potassium: 3.5 mEq/L — ABNORMAL LOW (ref 3.7–5.3)
Sodium: 141 mEq/L (ref 137–147)
Total Bilirubin: 0.2 mg/dL — ABNORMAL LOW (ref 0.3–1.2)
Total Protein: 8.3 g/dL (ref 6.0–8.3)

## 2013-10-28 LAB — CBC
HCT: 38.4 % (ref 36.0–46.0)
Hemoglobin: 13 g/dL (ref 12.0–15.0)
MCH: 31.7 pg (ref 26.0–34.0)
MCHC: 33.9 g/dL (ref 30.0–36.0)
MCV: 93.7 fL (ref 78.0–100.0)
Platelets: 287 10*3/uL (ref 150–400)
RBC: 4.1 MIL/uL (ref 3.87–5.11)
RDW: 12.5 % (ref 11.5–15.5)
WBC: 7.7 10*3/uL (ref 4.0–10.5)

## 2013-10-28 LAB — URINE MICROSCOPIC-ADD ON

## 2013-10-28 LAB — LIPASE, BLOOD: Lipase: 26 U/L (ref 11–59)

## 2013-10-28 LAB — POC URINE PREG, ED: Preg Test, Ur: NEGATIVE

## 2013-10-28 MED ORDER — ONDANSETRON HCL 4 MG/2ML IJ SOLN
4.0000 mg | Freq: Once | INTRAMUSCULAR | Status: AC
  Administered 2013-10-28: 4 mg via INTRAVENOUS
  Filled 2013-10-28: qty 2

## 2013-10-28 MED ORDER — ONDANSETRON 4 MG PO TBDP: ORAL_TABLET | ORAL | Status: DC

## 2013-10-28 NOTE — ED Provider Notes (Signed)
CSN: [id number]     Arrival date & time 10/28/13  1303 History   First MD Initiated Contact with Patient 10/28/13 1311     Chief Complaint  Patient presents with  . Emesis  . Diarrhea     (Consider location/radiation/quality/duration/timing/severity/associated sxs/prior Treatment) HPI Comments: 28 year old female with a past medical history of IBS presents to the emergency department with sudden onset she woke up from sleep today. Patient states she had 4-5 episodes of white/clear appearing emesis. Admits to associated midepigastric abdominal pain described as cramping. States she believes this is from vomiting. States she was feeling fine last night, had a bowl of cereal for dinner. Patient states the last time she had these symptoms she was diagnosed with urinary tract infection. Denies increased urinary frequency, urgency, dysuria, hematuria, vaginal discharge, fevers. Yesterday was the last day of her menstrual cycle. No sick contacts. Denies diarrhea, states she has been slightly constipated over the past few days. No history of abdominal surgeries. Denies alcohol intake.  Patient is a 28 y.o. female presenting with vomiting and diarrhea. The history is provided by the patient.  Emesis Associated symptoms: abdominal pain and diarrhea   Diarrhea Associated symptoms: abdominal pain and vomiting     Past Medical History  Diagnosis Date  . IBS (irritable bowel syndrome)    No past surgical history on file. No family history on file. History  Substance Use Topics  . Smoking status: Never Smoker   . Smokeless tobacco: Not on file  . Alcohol Use: No   OB History   Grav Para Term Preterm Abortions TAB SAB Ect Mult Living                 Review of Systems  Gastrointestinal: Positive for vomiting and abdominal pain.  All other systems reviewed and are negative.     Allergies  Ketamine and Latex  Home Medications   Prior to Admission medications   Medication Sig Start Date  End Date Taking? Authorizing Provider  norethindrone-ethinyl estradiol (MICROGESTIN) 1-20 MG-MCG tablet Take 1 tablet by mouth at bedtime.    Historical Provider, MD   BP 151/101  Pulse 88  Temp(Src) 98.2 F (36.8 C) (Oral)  Resp 20  SpO2 100% Physical Exam  Nursing note and vitals reviewed. Constitutional: She is oriented to person, place, and time. She appears well-developed and well-nourished. No distress.  HENT:  Head: Normocephalic and atraumatic.  Mouth/Throat: Oropharynx is clear and moist.  Eyes: Conjunctivae are normal. No scleral icterus.  Neck: Normal range of motion. Neck supple.  Cardiovascular: Normal rate, regular rhythm and normal heart sounds.   Pulmonary/Chest: Effort normal and breath sounds normal.  Abdominal: Soft. Normal appearance and bowel sounds are normal. She exhibits no distension. There is tenderness in the right upper quadrant, epigastric area and periumbilical area. There is guarding. There is no rigidity, no rebound, no CVA tenderness, no tenderness at McBurney's point and negative Murphy's sign.  No peritoneal signs. Tenderness worse mid-epigastric.  Musculoskeletal: Normal range of motion. She exhibits no edema.  Neurological: She is alert and oriented to person, place, and time.  Skin: Skin is warm and dry. She is not diaphoretic.  Psychiatric: She has a normal mood and affect. Her behavior is normal.    ED Course  Procedures (including critical care time) Labs Review Labs Reviewed  URINALYSIS, ROUTINE W REFLEX MICROSCOPIC - Abnormal; Notable for the following:    APPearance CLOUDY (*)    Hgb urine dipstick MODERATE (*)  Leukocytes, UA SMALL (*)    All other components within normal limits  COMPREHENSIVE METABOLIC PANEL - Abnormal; Notable for the following:    Potassium 3.5 (*)    Glucose, Bld 124 (*)    AST 42 (*)    Total Bilirubin 0.2 (*)    All other components within normal limits  URINE MICROSCOPIC-ADD ON - Abnormal; Notable for  the following:    Squamous Epithelial / LPF FEW (*)    All other components within normal limits  CBC  LIPASE, BLOOD  POC URINE PREG, ED    Imaging Review No results found.   EKG Interpretation None      MDM   Final diagnoses:  Nausea and vomiting, vomiting of unspecified type   Patient presenting with sudden onset vomiting. She is nontoxic appearing and in no apparent distress. Afebrile, hypertensive, vital signs otherwise stable. Abdomen is tender in the midepigastric, right upper quadrant and periumbilical regions without peritoneal signs, pain worse in midepigastric. Negative Murphy's. She is receiving IV fluids. Labs, urinalysis pending. Zofran given for nausea. 2:30 PM Urinalysis not concerning for infection. Few squamous epithelial cells, 3-6 white blood cells, small leukocytes. Labs without any acute finding. Mild elevation of AST, patient is on birth control pills. On reevaluation, abdomen is soft, tender in midepigastric area, improved from initial exam. She has not had any further vomiting. Plan to give PO challenge and reassess. 3:15 PM Pt able to tolerate ginger ale. States she is slightly nauseated but has not vomited. Stable for d/c. Most likely viral illness. Will d/c with zofran. Return precautions given. Patient states understanding of treatment care plan and is agreeable.  Trevor MaceRobyn M Albert, PA-C 10/28/13 1514

## 2013-10-28 NOTE — ED Provider Notes (Signed)
Medical screening examination/treatment/procedure(s) were performed by non-physician practitioner and as supervising physician I was immediately available for consultation/collaboration.   EKG Interpretation None        Layla MawKristen N Ward, DO 10/28/13 1534

## 2013-10-28 NOTE — ED Notes (Signed)
Pt reports waking up with emesis and diarrhea. Pt states began hurting afterwards. Pt actively vomiting in triage. Clear emesis. Did not eat anything questionable yesterday, denies being around anyone who is sick.

## 2013-10-28 NOTE — Discharge Instructions (Signed)
Take zofran as directed as needed for nausea.

## 2013-11-01 ENCOUNTER — Ambulatory Visit
Admission: RE | Admit: 2013-11-01 | Discharge: 2013-11-01 | Disposition: A | Discharge status: Home / Self Care | Payer: BC Managed Care – PPO | Source: Ambulatory Visit | Attending: Family Medicine | Admitting: Family Medicine

## 2013-11-01 ENCOUNTER — Ambulatory Visit (INDEPENDENT_AMBULATORY_CARE_PROVIDER_SITE_OTHER): Payer: BC Managed Care – PPO

## 2013-11-01 ENCOUNTER — Ambulatory Visit (INDEPENDENT_AMBULATORY_CARE_PROVIDER_SITE_OTHER): Payer: BC Managed Care – PPO | Admitting: Family Medicine

## 2013-11-01 VITALS — BP 110/66 | HR 87 | Temp 98.6°F | Resp 12 | Ht 60.0 in | Wt 135.0 lb

## 2013-11-01 DIAGNOSIS — R112 Nausea with vomiting, unspecified: Secondary | ICD-10-CM

## 2013-11-01 DIAGNOSIS — R1011 Right upper quadrant pain: Secondary | ICD-10-CM

## 2013-11-01 LAB — COMPREHENSIVE METABOLIC PANEL
ALT: 19 U/L (ref 0–35)
AST: 22 U/L (ref 0–37)
Albumin: 4.9 g/dL (ref 3.5–5.2)
Alkaline Phosphatase: 64 U/L (ref 39–117)
BUN: 7 mg/dL (ref 6–23)
CO2: 25 mEq/L (ref 19–32)
Calcium: 10 mg/dL (ref 8.4–10.5)
Chloride: 103 mEq/L (ref 96–112)
Creat: 0.67 mg/dL (ref 0.50–1.10)
Glucose, Bld: 94 mg/dL (ref 70–99)
Potassium: 3.4 mEq/L — ABNORMAL LOW (ref 3.5–5.3)
Sodium: 141 mEq/L (ref 135–145)
Total Bilirubin: 0.5 mg/dL (ref 0.2–1.2)
Total Protein: 8.5 g/dL — ABNORMAL HIGH (ref 6.0–8.3)

## 2013-11-01 LAB — CBC WITH DIFFERENTIAL/PLATELET
Basophils Absolute: 0 10*3/uL (ref 0.0–0.1)
Basophils Relative: 0 % (ref 0–1)
Eosinophils Absolute: 0 10*3/uL (ref 0.0–0.7)
Eosinophils Relative: 0 % (ref 0–5)
HCT: 37.5 % (ref 36.0–46.0)
Hemoglobin: 12.8 g/dL (ref 12.0–15.0)
Lymphocytes Relative: 17 % (ref 12–46)
Lymphs Abs: 1.6 10*3/uL (ref 0.7–4.0)
MCH: 31.1 pg (ref 26.0–34.0)
MCHC: 34.1 g/dL (ref 30.0–36.0)
MCV: 91.2 fL (ref 78.0–100.0)
Monocytes Absolute: 0.4 10*3/uL (ref 0.1–1.0)
Monocytes Relative: 4 % (ref 3–12)
Neutro Abs: 7.3 10*3/uL (ref 1.7–7.7)
Neutrophils Relative %: 79 % — ABNORMAL HIGH (ref 43–77)
Platelets: 286 10*3/uL (ref 150–400)
RBC: 4.11 MIL/uL (ref 3.87–5.11)
RDW: 13 % (ref 11.5–15.5)
WBC: 9.3 10*3/uL (ref 4.0–10.5)

## 2013-11-01 LAB — AMYLASE: Amylase: 57 U/L (ref 0–105)

## 2013-11-01 LAB — POCT URINE PREGNANCY: Preg Test, Ur: NEGATIVE

## 2013-11-01 LAB — LIPASE: Lipase: 11 U/L (ref 0–75)

## 2013-11-01 MED ORDER — ONDANSETRON HCL 4 MG/2ML IJ SOLN: 4.0000 mg | Freq: Once | INTRAMUSCULAR | Status: DC

## 2013-11-01 MED ORDER — SODIUM CHLORIDE 0.9 % IV SOLN
4.0000 mg | Freq: Once | INTRAVENOUS | Status: AC
  Administered 2013-11-01: 4 mg via INTRAVENOUS

## 2013-11-01 NOTE — Progress Notes (Signed)
[facility] [address] [place] [address] [phone]  Date:  11/01/2013   Name:  Patricia Whitehead   DOB:  1986-02-09   MRN:  [id number]  PCP:  No PCP Per Patient    Chief Complaint: vommitting   History of Present Illness:  Patricia Whitehead is a 28 y.o. very pleasant female patient who presents with the following:  History of IBS, anxiety and depression.  Here today with a GI complaint. She was seen in the ED on the 4th with vomiting and abdominal cramping. She was noted to have RUQ tenderness.  She was given IVF and had labs which were reassuring, released to home She still has vomiting which is worse in the mornings.  She has thrown up about 5x this am.  She will vomit just clear, watery material.  She was given some zofran which she has tried to use, but she seems to still throw up.   She also had diarrhea a few times- yesterday she had a lot of diarrhea in the am.  So far she has had diarrhea twice today.   She has pain in her upper abdomen, but is not sure if it is more on the right or just in the center.    She has not noted a fever.  No sick contacts.  No recent travel, no suspicious foods.  Her mother and her sister have had cholecystectomies.  She has not been dx with any gallbladder issues as of yet.    She is generally healthy except for IBS, which usually manifests as constipation but can be diarrhea at times as well.    She has never had any abdominal operations.    She is not eating, she is drinking water but it is coming back up.   She is still urinating.    She did not eat anything today.  She did have some water about 30 minutes ago.    She had 4 mg of zofran ODT this am- however this did not seem to help and she threw up after taking it.  She has not missed a period, she is on OCP  Wt Readings from Last 3 Encounters:  11/01/13 135 lb (61.236 kg)  09/27/12 135 lb (61.236 kg)  10/23/08 142 lb 12.8 oz (64.774 kg)     Patient Active  Problem List   Diagnosis Date Noted  . NAUSEA 10/23/2008  . DYSPEPSIA 09/12/2008  . ABDOMINAL PAIN RIGHT UPPER QUADRANT 07/20/2007    Past Medical History  Diagnosis Date  . IBS (irritable bowel syndrome)   . Allergy   . Anemia   . Anxiety   . Depression     No past surgical history on file.  History  Substance Use Topics  . Smoking status: Never Smoker   . Smokeless tobacco: Not on file  . Alcohol Use: No    Family History  Problem Relation Age of Onset  . Cancer Mother   . Diabetes Mother     Allergies  Allergen Reactions  . Other Swelling    Muscle relaxer's   . Ketamine Other (See Comments)    seizures  . Latex Itching and Rash    Medication list has been reviewed and updated.  Current Outpatient Prescriptions on File Prior to Visit  Medication Sig Dispense Refill  . levonorgestrel-ethinyl estradiol (AUBRA) 0.1-20 MG-MCG tablet Take 1 tablet by mouth at bedtime.      . ondansetron (ZOFRAN ODT) 4 MG disintegrating tablet  4m ODT q4 hours prn nausea/vomit  8 tablet  0   No current facility-administered medications on file prior to visit.    Review of Systems:  As per HPI- otherwise negative.   Physical Examination: Filed Vitals:   11/01/13 0957  BP: 110/66  Pulse: 87  Temp: 98.6 F (37 C)  Resp: 12   Filed Vitals:   11/01/13 0957  Height: 5' (1.524 m)  Weight: 135 lb (61.236 kg)   Body mass index is 26.37 kg/(m^2). Ideal Body Weight: Weight in (lb) to have BMI = 25: 127.7  GEN: WDWN, NAD, Non-toxic, A & O x 3, looks well HEENT: Atraumatic, Normocephalic. Neck supple. No masses, No LAD.  Bilateral TM wnl, oropharynx normal.  PEERL,EOMI.   Ears and Nose: No external deformity. CV: RRR, No M/G/R. No JVD. No thrill. No extra heart sounds. PULM: CTA B, no wheezes, crackles, rhonchi. No retractions. No resp. distress. No accessory muscle use. ABD: S,  ND, +BS. No rebound. No HSM.  She has epigastric and RUQ tenderness, positive murphy's  sign EXTR: No c/c/e NEURO Normal gait.  PSYCH: Normally interactive. Conversant. Not depressed or anxious appearing.  Calm demeanor.   Results for orders placed in visit on 11/01/13  POCT URINE PREGNANCY      Result Value Ref Range   Preg Test, Ur Negative     [facility] reading (PRIMARY) by  Dr. CLorelei Pont abd series: no evidence or obstruction, very little stool  ACUTE ABDOMEN SERIES (ABDOMEN 2 VIEW & CHEST 1 VIEW)  COMPARISON: Two-view chest 07/29/2009  FINDINGS: There is no evidence of dilated bowel loops or free intraperitoneal air. No radiopaque calculi or other significant radiographic abnormality is seen. Heart size and mediastinal contours are within normal limits. Both lungs are clear.  IMPRESSION: Negative abdominal radiographs. No acute cardiopulmonary disease.  Given IL of IV saline and 474mof IV zofran in clinic which did help ease her sx.   Assessment and Plan: Non-intractable vomiting with nausea, vomiting of unspecified type - Plan: POCT UA - Microscopic Only, POCT urinalysis dipstick, POCT urine pregnancy, Urine culture, CBC with Differential, Comprehensive metabolic panel, Amylase, Lipase, DG Abd Acute W/Chest, ondansetron (ZOFRAN) 4 mg in sodium chloride 0.9 % 50 mL IVPB, DISCONTINUED: ondansetron (ZOFRAN) injection 4 mg  RUQ pain - Plan: POCT UA - Microscopic Only, POCT urinalysis dipstick, POCT urine pregnancy, Urine culture, CBC with Differential, Comprehensive metabolic panel, Amylase, Lipase, DG Abd Acute W/Chest, [place]domen Limited RUQ  [age] old young woman here today with vomiting and intermittent diarrhea for 4 or 5 days.  She has RUQ abdominal tenderness which is concerning for gallbladder colic.  Will send for an [place], and labs as above.   Signed JeLamar BlinksMD  Received her labs and [place]ort, and gave her a call.  Her labs are reassuring and her ultrasound is negative. So far we have not found a cause for her sx.  Discussed the distant  possibility of appendicitis and offered a CT scan today to absolutely rule this out.  She declines a CT today, but would like to do this tomorrow when she has a ride.  Arranged a CT for tomorrow. She will continue zofran and try to take fluids.  If worsening overnight she will go to the ER.   Results for orders placed in visit on 11/01/13  CBC WITH DIFFERENTIAL      Result Value Ref Range   WBC 9.3  4.0 - 10.5 K/uL  RBC 4.11  3.87 - 5.11 MIL/uL   Hemoglobin 12.8  12.0 - 15.0 g/dL   HCT 37.5  36.0 - 46.0 %   MCV 91.2  78.0 - 100.0 fL   MCH 31.1  26.0 - 34.0 pg   MCHC 34.1  30.0 - 36.0 g/dL   RDW 13.0  11.5 - 15.5 %   Platelets 286  150 - 400 K/uL   Neutrophils Relative % 79 (*) 43 - 77 %   Neutro Abs 7.3  1.7 - 7.7 K/uL   Lymphocytes Relative 17  12 - 46 %   Lymphs Abs 1.6  0.7 - 4.0 K/uL   Monocytes Relative 4  3 - 12 %   Monocytes Absolute 0.4  0.1 - 1.0 K/uL   Eosinophils Relative 0  0 - 5 %   Eosinophils Absolute 0.0  0.0 - 0.7 K/uL   Basophils Relative 0  0 - 1 %   Basophils Absolute 0.0  0.0 - 0.1 K/uL   Smear Review Criteria for review not met    COMPREHENSIVE METABOLIC PANEL      Result Value Ref Range   Sodium 141  135 - 145 mEq/L   Potassium 3.4 (*) 3.5 - 5.3 mEq/L   Chloride 103  96 - 112 mEq/L   CO2 25  19 - 32 mEq/L   Glucose, Bld 94  70 - 99 mg/dL   BUN 7  6 - 23 mg/dL   Creat 0.67  0.50 - 1.10 mg/dL   Total Bilirubin 0.5  0.2 - 1.2 mg/dL   Alkaline Phosphatase 64  39 - 117 U/L   AST 22  0 - 37 U/L   ALT 19  0 - 35 U/L   Total Protein 8.5 (*) 6.0 - 8.3 g/dL   Albumin 4.9  3.5 - 5.2 g/dL   Calcium 10.0  8.4 - 10.5 mg/dL  AMYLASE      Result Value Ref Range   Amylase 57  0 - 105 U/L  LIPASE      Result Value Ref Range   Lipase 11  0 - 75 U/L  POCT URINE PREGNANCY      Result Value Ref Range   Preg Test, Ur Negative     US ABDOMEN LIMITED - RIGHT UPPER QUADRANT  COMPARISON: None.  FINDINGS: Gallbladder:  No gallstones or wall thickening visualized.  No sonographic Murphy sign noted.  Common bile duct:  Diameter: 3.5 mm  Liver:No focal lesion identified. Within normal limits in parenchymal echogenicity.  IMPRESSION: No acute abnormality noted.

## 2013-11-01 NOTE — Patient Instructions (Signed)
I will give you a call later on today with the rest of your labs and ultrasound report.  Please go to your ultrasound as directed- after this you can go home

## 2013-11-02 ENCOUNTER — Other Ambulatory Visit: Payer: BC Managed Care – PPO

## 2013-11-02 ENCOUNTER — Ambulatory Visit (INDEPENDENT_AMBULATORY_CARE_PROVIDER_SITE_OTHER): Payer: BC Managed Care – PPO | Admitting: Family Medicine

## 2013-11-02 ENCOUNTER — Encounter: Payer: Self-pay | Admitting: Family Medicine

## 2013-11-02 VITALS — BP 110/70 | HR 65 | Temp 98.6°F | Resp 16 | Wt 132.0 lb

## 2013-11-02 DIAGNOSIS — R112 Nausea with vomiting, unspecified: Secondary | ICD-10-CM

## 2013-11-02 LAB — URINE CULTURE

## 2013-11-02 MED ORDER — DICYCLOMINE HCL 20 MG PO TABS: 20.0000 mg | ORAL_TABLET | Freq: Four times a day (QID) | ORAL | Status: DC

## 2013-11-02 NOTE — Patient Instructions (Signed)
I am glad that you are feeling a bit better!  Try the bentyl as needed for stomach cramping, and you can continue zofran as needed.  We will hold off of a CT unless you get worse again.  If you have any other concerns or issues please call me or come back in- seek care at the ER if any severe symptoms

## 2013-11-02 NOTE — Progress Notes (Signed)
[facility] [address], [place] [id number] [phone]  Date:  11/02/2013   Name:  Patricia ForemanJanet Whitehead   DOB:  Mar 25, 1986   MRN:  [id number]  PCP:  No PCP Per Patient    Chief Complaint: Follow-up   History of Present Illness:  Patricia ForemanJanet Whitehead is a 28 y.o. very pleasant female patient who presents with the following:  She is here to recheck today prior to having a CT scan.  She does not feel she will be able to drink her contrast, but also feels that overall she is better and may not need the CT.  She awoke with nausea again this am.  She took a zofran around 4am, vomited and was able to get back to sleep. She has not been able to eat yet today but did tolerate fluids ok.  This is an improvement.   She has vomited twice so far today-this morning.  None later in the day She had diarrhea twice this am.   Overall her abdominal pain is better and she feels improved. No fever  Patient Active Problem List   Diagnosis Date Noted  . NAUSEA 10/23/2008  . DYSPEPSIA 09/12/2008  . ABDOMINAL PAIN RIGHT UPPER QUADRANT 07/20/2007    Past Medical History  Diagnosis Date  . IBS (irritable bowel syndrome)   . Allergy   . Anemia   . Anxiety   . Depression     History reviewed. No pertinent past surgical history.  History  Substance Use Topics  . Smoking status: Never Smoker   . Smokeless tobacco: Not on file  . Alcohol Use: No    Family History  Problem Relation Age of Onset  . Cancer Mother   . Diabetes Mother     Allergies  Allergen Reactions  . Other Swelling    Muscle relaxer's   . Ketamine Other (See Comments)    seizures  . Latex Itching and Rash    Medication list has been reviewed and updated.  Current Outpatient Prescriptions on File Prior to Visit  Medication Sig Dispense Refill  . levonorgestrel-ethinyl estradiol (AUBRA) 0.1-20 MG-MCG tablet Take 1 tablet by mouth at bedtime.      . ondansetron (ZOFRAN ODT) 4 MG disintegrating tablet 4mg  ODT  q4 hours prn nausea/vomit  8 tablet  0   No current facility-administered medications on file prior to visit.    Review of Systems: As per HPI- otherwise negative.   Physical Examination: Filed Vitals:   11/02/13 1259  BP: 110/70  Pulse: 65  Temp: 98.6 F (37 C)  Resp: 16   Filed Vitals:   11/02/13 1259  Weight: 132 lb (59.875 kg)   Body mass index is 25.78 kg/(m^2). Ideal Body Weight:    GEN: WDWN, NAD, Non-toxic, A & O x 3, looks well today HEENT: Atraumatic, Normocephalic. Neck supple. No masses, No LAD. Ears and Nose: No external deformity. CV: RRR, No M/G/R. No JVD. No thrill. No extra heart sounds. PULM: CTA B, no wheezes, crackles, rhonchi. No retractions. No resp. distress. No accessory muscle use. ABD: S, NT, ND, +BS. No rebound. No HSM.  Benign exam EXTR: No c/c/e NEURO Normal gait.  PSYCH: Normally interactive. Conversant. Not depressed or anxious appearing.  Calm demeanor.    Assessment and Plan: Non-intractable vomiting with nausea, vomiting of unspecified type - Plan: dicyclomine (BENTYL) 20 MG tablet  Doing better today.  She would prefer to cancel the CT, which is reasonable.  Will try some  bentyl to use as needed, along with zofran.  She will le tme know if not continuing to feel better over the next several days  Signed Abbe Amsterdam, MD

## 2014-07-10 ENCOUNTER — Ambulatory Visit (INDEPENDENT_AMBULATORY_CARE_PROVIDER_SITE_OTHER): Payer: BLUE CROSS/BLUE SHIELD | Admitting: Internal Medicine

## 2014-07-10 VITALS — BP 110/64 | HR 65 | Temp 98.2°F | Resp 17 | Ht 60.5 in | Wt 122.4 lb

## 2014-07-10 DIAGNOSIS — T7840XA Allergy, unspecified, initial encounter: Secondary | ICD-10-CM | POA: Diagnosis not present

## 2014-07-10 DIAGNOSIS — N760 Acute vaginitis: Secondary | ICD-10-CM | POA: Diagnosis not present

## 2014-07-10 DIAGNOSIS — Z833 Family history of diabetes mellitus: Secondary | ICD-10-CM

## 2014-07-10 DIAGNOSIS — L259 Unspecified contact dermatitis, unspecified cause: Secondary | ICD-10-CM | POA: Diagnosis not present

## 2014-07-10 LAB — POCT WET PREP WITH KOH
Clue Cells Wet Prep HPF POC: NEGATIVE
KOH Prep POC: NEGATIVE
Trichomonas, UA: NEGATIVE
Yeast Wet Prep HPF POC: NEGATIVE

## 2014-07-10 LAB — GLUCOSE, POCT (MANUAL RESULT ENTRY): POC Glucose: 98 mg/dl (ref 70–99)

## 2014-07-10 MED ORDER — CLOBETASOL PROPIONATE 0.05 % EX CREA: 1.0000 "application " | TOPICAL_CREAM | Freq: Two times a day (BID) | CUTANEOUS | Status: DC

## 2014-07-10 MED ORDER — FLUCONAZOLE 100 MG PO TABS: 100.0000 mg | ORAL_TABLET | Freq: Every day | ORAL | Status: DC

## 2014-07-10 MED ORDER — CETIRIZINE HCL 10 MG PO TABS: 10.0000 mg | ORAL_TABLET | Freq: Every day | ORAL | Status: DC

## 2014-07-10 NOTE — Patient Instructions (Signed)
Vaginitis Vaginitis is an inflammation of the vagina. It is most often caused by a change in the normal balance of the bacteria and yeast that live in the vagina. This change in balance causes an overgrowth of certain bacteria or yeast, which causes the inflammation. There are different types of vaginitis, but the most common types are:  Bacterial vaginosis.  Yeast infection (candidiasis).  Trichomoniasis vaginitis. This is a sexually transmitted infection (STI).  Viral vaginitis.  Atropic vaginitis.  Allergic vaginitis. CAUSES  The cause depends on the type of vaginitis. Vaginitis can be caused by:  Bacteria (bacterial vaginosis).  Yeast (yeast infection).  A parasite (trichomoniasis vaginitis)  A virus (viral vaginitis).  Low hormone levels (atrophic vaginitis). Low hormone levels can occur during pregnancy, breastfeeding, or after menopause.  Irritants, such as bubble baths, scented tampons, and feminine sprays (allergic vaginitis). Other factors can change the normal balance of the yeast and bacteria that live in the vagina. These include:  Antibiotic medicines.  Poor hygiene.  Diaphragms, vaginal sponges, spermicides, birth control pills, and intrauterine devices (IUD).  Sexual intercourse.  Infection.  Uncontrolled diabetes.  A weakened immune system. SYMPTOMS  Symptoms can vary depending on the cause of the vaginitis. Common symptoms include:  Abnormal vaginal discharge.  The discharge is white, gray, or yellow with bacterial vaginosis.  The discharge is thick, white, and cheesy with a yeast infection.  The discharge is frothy and yellow or greenish with trichomoniasis.  A bad vaginal odor.  The odor is fishy with bacterial vaginosis.  Vaginal itching, pain, or swelling.  Painful intercourse.  Pain or burning when urinating. Sometimes, there are no symptoms. TREATMENT  Treatment will vary depending on the type of infection.   Bacterial  vaginosis and trichomoniasis are often treated with antibiotic creams or pills.  Yeast infections are often treated with antifungal medicines, such as vaginal creams or suppositories.  Viral vaginitis has no cure, but symptoms can be treated with medicines that relieve discomfort. Your sexual partner should be treated as well.  Atrophic vaginitis may be treated with an estrogen cream, pill, suppository, or vaginal ring. If vaginal dryness occurs, lubricants and moisturizing creams may help. You may be told to avoid scented soaps, sprays, or douches.  Allergic vaginitis treatment involves quitting the use of the product that is causing the problem. Vaginal creams can be used to treat the symptoms. HOME CARE INSTRUCTIONS   Take all medicines as directed by your caregiver.  Keep your genital area clean and dry. Avoid soap and only rinse the area with water.  Avoid douching. It can remove the healthy bacteria in the vagina.  Do not use tampons or have sexual intercourse until your vaginitis has been treated. Use sanitary pads while you have vaginitis.  Wipe from front to back. This avoids the spread of bacteria from the rectum to the vagina.  Let air reach your genital area.  Wear cotton underwear to decrease moisture buildup.  Avoid wearing underwear while you sleep until your vaginitis is gone.  Avoid tight pants and underwear or nylons without a cotton panel.  Take off wet clothing (especially bathing suits) as soon as possible.  Use mild, non-scented products. Avoid using irritants, such as:  Scented feminine sprays.  Fabric softeners.  Scented detergents.  Scented tampons.  Scented soaps or bubble baths.  Practice safe sex and use condoms. Condoms may prevent the spread of trichomoniasis and viral vaginitis. SEEK MEDICAL CARE IF:   You have abdominal pain.  You   have a fever or persistent symptoms for more than 2-3 days.  You have a fever and your symptoms suddenly  get worse. Document Released: 02/08/2007 Document Revised: 01/06/2012 Document Reviewed: 09/24/2011 [facility] Patient Information 2015 [place], [place]. This information is not intended to replace advice given to you by your health care provider. Make sure you discuss any questions you have with your health care provider. Contact Dermatitis Contact dermatitis is a reaction to certain substances that touch the skin. Contact dermatitis can be either irritant contact dermatitis or allergic contact dermatitis. Irritant contact dermatitis does not require previous exposure to the substance for a reaction to occur.Allergic contact dermatitis only occurs if you have been exposed to the substance before. Upon a repeat exposure, your body reacts to the substance.  CAUSES  Many substances can cause contact dermatitis. Irritant dermatitis is most commonly caused by repeated exposure to mildly irritating substances, such as:  Makeup.  Soaps.  Detergents.  Bleaches.  Acids.  Metal salts, such as nickel. Allergic contact dermatitis is most commonly caused by exposure to:  Poisonous plants.  Chemicals (deodorants, shampoos).  Jewelry.  Latex.  Neomycin in triple antibiotic cream.  Preservatives in products, including clothing. SYMPTOMS  The area of skin that is exposed may develop:  Dryness or flaking.  Redness.  Cracks.  Itching.  Pain or a burning sensation.  Blisters. With allergic contact dermatitis, there may also be swelling in areas such as the eyelids, mouth, or genitals.  DIAGNOSIS  Your caregiver can usually tell what the problem is by doing a physical exam. In cases where the cause is uncertain and an allergic contact dermatitis is suspected, a patch skin test may be performed to help determine the cause of your dermatitis. TREATMENT Treatment includes protecting the skin from further contact with the irritating substance by avoiding that substance if possible. Barrier  creams, powders, and gloves may be helpful. Your caregiver may also recommend:  Steroid creams or ointments applied 2 times daily. For best results, soak the rash area in cool water for 20 minutes. Then apply the medicine. Cover the area with a plastic wrap. You can store the steroid cream in the refrigerator for a "chilly" effect on your rash. That may decrease itching. Oral steroid medicines may be needed in more severe cases.  Antibiotics or antibacterial ointments if a skin infection is present.  Antihistamine lotion or an antihistamine taken by mouth to ease itching.  Lubricants to keep moisture in your skin.  Burow's solution to reduce redness and soreness or to dry a weeping rash. Mix one packet or tablet of solution in 2 cups cool water. Dip a clean washcloth in the mixture, wring it out a bit, and put it on the affected area. Leave the cloth in place for 30 minutes. Do this as often as possible throughout the day.  Taking several cornstarch or baking soda baths daily if the area is too large to cover with a washcloth. Harsh chemicals, such as alkalis or acids, can cause skin damage that is like a burn. You should flush your skin for 15 to 20 minutes with cold water after such an exposure. You should also seek immediate medical care after exposure. Bandages (dressings), antibiotics, and pain medicine may be needed for severely irritated skin.  HOME CARE INSTRUCTIONS  Avoid the substance that caused your reaction.  Keep the area of skin that is affected away from hot water, soap, sunlight, chemicals, acidic substances, or anything else that would irritate your skin.  Do not scratch the rash. Scratching may cause the rash to become infected.  You may take cool baths to help stop the itching.  Only take over-the-counter or prescription medicines as directed by your caregiver.  See your caregiver for follow-up care as directed to make sure your skin is healing properly. SEEK MEDICAL  CARE IF:   Your condition is not better after 3 days of treatment.  You seem to be getting worse.  You see signs of infection such as swelling, tenderness, redness, soreness, or warmth in the affected area.  You have any problems related to your medicines. Document Released: 04/10/2000 Document Revised: 07/06/2011 Document Reviewed: 09/16/2010 [facility] Patient Information 2015 [place]. This information is not intended to replace advice given to you by your health care provider. Make sure you discuss any questions you have with your health care provider.

## 2014-07-10 NOTE — Progress Notes (Signed)
   Subjective:    Patient ID: Patricia Whitehead, female    DOB: 1985-07-22, 29 y.o.   MRN: [id number]  HPI pt with rash on left hand. Denies any insect bite. It does itch.  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 She has tried Benadryl cream with no relief. It feels like it is swollen. No exposure to travel, no exposure to bed bugs.   Itchy, white discharge from vagina. Says it is typical of her normal yeast infections. She had them quite frequently before they changed her birth control, but since then it has been more under control. Edited by me. Rash left hand started on dorsum grown insize, very itchy, now satellite small circular patch up arm. No rash, lesions, or pain on genitalia by hx.  Review of Systems     Objective:   Physical Exam  Constitutional: She is oriented to person, place, and time. She appears well-developed and well-nourished. No distress.  HENT:  Head: Normocephalic.  Eyes: EOM are normal. Pupils are equal, round, and reactive to light.  Neck: Normal range of motion.  Pulmonary/Chest: Effort normal.  Musculoskeletal: Normal range of motion.  Neurological: She is alert and oriented to person, place, and time. No cranial nerve deficit. She exhibits normal muscle tone. Coordination normal.  Skin: Skin is intact. Lesion and rash noted. Rash is maculopapular. There is erythema.     Non scaly erthematous circular itchy rash.  No debrie for KOH scraping  Psychiatric: She has a normal mood and affect.  Vitals reviewed.  Self wet prep Results for orders placed or performed in visit on 07/10/14  POCT Wet Prep with KOH  Result Value Ref Range   Trichomonas, UA Negative    Clue Cells Wet Prep HPF POC neg    Epithelial Wet Prep HPF POC 3-8    Yeast Wet Prep HPF POC neg    Bacteria Wet Prep HPF POC 1+    RBC Wet Prep HPF POC 0-2    WBC Wet Prep HPF POC 0-4    KOH Prep POC Negative   POCT glucose (manual entry)  Result Value Ref Range   POC Glucose 98 70 - 99 mg/dl          Assessment & Plan:  Vaginitis probable yeast Pruitic rash dorsum hand and wrist/Tinea versus contact  Diflucan 100mg   Clobetasol cram

## 2014-07-30 ENCOUNTER — Ambulatory Visit: Payer: BLUE CROSS/BLUE SHIELD | Admitting: Physician Assistant

## 2014-08-06 ENCOUNTER — Ambulatory Visit (INDEPENDENT_AMBULATORY_CARE_PROVIDER_SITE_OTHER): Payer: BLUE CROSS/BLUE SHIELD | Admitting: Physician Assistant

## 2014-08-06 VITALS — BP 102/68 | HR 63 | Temp 98.3°F | Resp 16 | Ht 61.5 in | Wt 120.0 lb

## 2014-08-06 DIAGNOSIS — R5383 Other fatigue: Secondary | ICD-10-CM | POA: Diagnosis not present

## 2014-08-06 DIAGNOSIS — K589 Irritable bowel syndrome without diarrhea: Secondary | ICD-10-CM

## 2014-08-06 DIAGNOSIS — R1084 Generalized abdominal pain: Secondary | ICD-10-CM | POA: Diagnosis not present

## 2014-08-06 DIAGNOSIS — M25579 Pain in unspecified ankle and joints of unspecified foot: Secondary | ICD-10-CM | POA: Diagnosis not present

## 2014-08-06 LAB — POCT CBC
Granulocyte percent: 54 %G (ref 37–80)
HCT, POC: 36.5 % — AB (ref 37.7–47.9)
Hemoglobin: 12 g/dL — AB (ref 12.2–16.2)
Lymph, poc: 2.9 (ref 0.6–3.4)
MCH, POC: 30.2 pg (ref 27–31.2)
MCHC: 32.9 g/dL (ref 31.8–35.4)
MCV: 91.7 fL (ref 80–97)
MID (cbc): 0.5 (ref 0–0.9)
MPV: 8.1 fL (ref 0–99.8)
POC Granulocyte: 4 (ref 2–6.9)
POC LYMPH PERCENT: 39.6 %L (ref 10–50)
POC MID %: 6.4 %M (ref 0–12)
Platelet Count, POC: 250 10*3/uL (ref 142–424)
RBC: 3.98 M/uL — AB (ref 4.04–5.48)
RDW, POC: 13.7 %
WBC: 7.4 10*3/uL (ref 4.6–10.2)

## 2014-08-06 LAB — POCT SEDIMENTATION RATE: POCT SED RATE: 30 mm/hr — AB (ref 0–22)

## 2014-08-06 MED ORDER — OMEPRAZOLE 20 MG PO CPDR: 20.0000 mg | DELAYED_RELEASE_CAPSULE | Freq: Every day | ORAL | Status: DC

## 2014-08-06 MED ORDER — LINACLOTIDE 290 MCG PO CAPS: 290.0000 ug | ORAL_CAPSULE | Freq: Every day | ORAL | Status: DC

## 2014-08-06 MED ORDER — DICLOFENAC SODIUM 1 % TD GEL: 2.0000 g | Freq: Four times a day (QID) | TRANSDERMAL | Status: DC

## 2014-08-06 NOTE — Patient Instructions (Addendum)
Diet and Irritable Bowel Syndrome  No cure has been found for irritable bowel syndrome (IBS). Many options are available to treat the symptoms. Your caregiver will give you the best treatments available for your symptoms. He or she will also encourage you to manage stress and to make changes to your diet. You need to work with your caregiver and Registered Dietician to find the best combination of medicine, diet, counseling, and support to control your symptoms. The following are some diet suggestions. FOODS THAT MAKE IBS WORSE  Fatty foods, such as French fries.  Milk products, such as cheese or ice cream.  Chocolate.  Alcohol.  Caffeine (found in coffee and some sodas).  Carbonated drinks, such as soda. If certain foods cause symptoms, you should eat less of them or stop eating them. FOOD JOURNAL   Keep a journal of the foods that seem to cause distress. Write down:  What you are eating during the day and when.  What problems you are having after eating.  When the symptoms occur in relation to your meals.  What foods always make you feel badly.  Take your notes with you to your caregiver to see if you should stop eating certain foods. FOODS THAT MAKE IBS BETTER Fiber reduces IBS symptoms, especially constipation, because it makes stools soft, bulky, and easier to pass. Fiber is found in bran, bread, cereal, beans, fruit, and vegetables. Examples of foods with fiber include:  Apples.  Peaches.  Pears.  Berries.  Figs.  Broccoli, raw.  Cabbage.  Carrots.  Raw peas.  Kidney beans.  Lima beans.  Whole-grain bread.  Whole-grain cereal. Add foods with fiber to your diet a little at a time. This will let your body get used to them. Too much fiber at once might cause gas and swelling of your abdomen. This can trigger symptoms in a person with IBS. Caregivers usually recommend a diet with enough fiber to produce soft, painless bowel movements. High fiber diets may  cause gas and bloating. However, these symptoms often go away within a few weeks, as your body adjusts. In many cases, dietary fiber may lessen IBS symptoms, particularly constipation. However, it may not help pain or diarrhea. High fiber diets keep the colon mildly enlarged (distended) with the added fiber. This may help prevent spasms in the colon. Some forms of fiber also keep water in the stool, thereby preventing hard stools that are difficult to pass.  Besides telling you to eat more foods with fiber, your caregiver may also tell you to get more fiber by taking a fiber pill or drinking water mixed with a special high fiber powder. An example of this is a natural fiber laxative containing psyllium seed.  TIPS  Large meals can cause cramping and diarrhea in people with IBS. If this happens to you, try eating 4 or 5 small meals a day, or try eating less at each of your usual 3 meals. It may also help if your meals are low in fat and high in carbohydrates. Examples of carbohydrates are pasta, rice, whole-grain breads and cereals, fruits, and vegetables.  If dairy products cause your symptoms to flare up, you can try eating less of those foods. You might be able to handle yogurt better than other dairy products, because it contains bacteria that helps with digestion. Dairy products are an important source of calcium and other nutrients. If you need to avoid dairy products, be sure to talk with a Registered Dietitian about getting these nutrients   through other food sources.  Drink enough water and fluids to keep your urine clear or pale yellow. This is important, especially if you have diarrhea. FOR MORE INFORMATION  International Foundation for Functional Gastrointestinal Disorders: www.iffgd.org  National Digestive Diseases Information Clearinghouse: digestive.niddk.nih.gov Document Released: 07/04/2003 Document Revised: 07/06/2011 Document Reviewed: 07/14/2013 [employer] Patient Information 2015  [employer]. This information is not intended to replace advice given to you by your health care provider. Make sure you discuss any questions you have with your health care provider.  
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
## 2014-08-07 ENCOUNTER — Telehealth: Payer: Self-pay

## 2014-08-07 DIAGNOSIS — M25579 Pain in unspecified ankle and joints of unspecified foot: Secondary | ICD-10-CM

## 2014-08-07 DIAGNOSIS — F411 Generalized anxiety disorder: Secondary | ICD-10-CM

## 2014-08-07 LAB — TSH: TSH: 1.871 u[IU]/mL (ref 0.350–4.500)

## 2014-08-07 NOTE — Telephone Encounter (Signed)
She should talk with Judeth CornfieldStephanie about this. I have routed to DynegyStephanie's inbox.

## 2014-08-07 NOTE — Telephone Encounter (Signed)
Please advise on getting diclofenac pills for pt.

## 2014-08-07 NOTE — Telephone Encounter (Signed)
PA started for Diclofenac 1% gel. 

## 2014-08-07 NOTE — Telephone Encounter (Signed)
Patient was seen yesterday and was prescribed diclofenac. Patient states that the pharmacy is out of stock of the creme and she would like the pill version of the medication sent instead. This message is marked as high priority because the patient is in extreme pain. The pharmacy is [facility] on [address] and he phone number is [phone]. Patient would like a voicemail if she misses a phone call.

## 2014-08-07 NOTE — Telephone Encounter (Signed)
Called pt, advised Rx sent in. Can she have a medication for anxiety. She would like a pill to take everyday for anxiety like Zoloft. Please advise.

## 2014-08-08 LAB — TISSUE TRANSGLUTAMINASE, IGA: Tissue Transglutaminase Ab, IgA: 1 U/mL (ref <4)

## 2014-08-08 MED ORDER — FLUOXETINE HCL 10 MG PO CAPS: 10.0000 mg | ORAL_CAPSULE | Freq: Every day | ORAL | Status: DC

## 2014-08-08 MED ORDER — MELOXICAM 15 MG PO TABS: 7.5000 mg | ORAL_TABLET | Freq: Every day | ORAL | Status: DC

## 2014-08-08 NOTE — Telephone Encounter (Signed)
Left message on patient's voicemail: I am starting her on mobic once per day, as I was told in person the voltaren gel was too expensive by call-taken.  Tthat is not as harsh as taking ibuprofen for the pain.  I am also starting her on fluoxetine $RemoveBefor [id number]$[id number]$20mglow up.  She should have filled the linzess and taking for the ibs-c.  If this does not help, I am referring her to gastro.   TSH was normal CBC slightly decreased, not concerning at this time TTG not in as of yet.

## 2014-08-09 ENCOUNTER — Encounter (HOSPITAL_COMMUNITY): Payer: Self-pay

## 2014-08-09 ENCOUNTER — Emergency Department (HOSPITAL_COMMUNITY)
Admission: EM | Admit: 2014-08-09 | Discharge: 2014-08-09 | Disposition: A | Discharge status: Home / Self Care | Payer: BLUE CROSS/BLUE SHIELD | Attending: Emergency Medicine | Admitting: Emergency Medicine

## 2014-08-09 DIAGNOSIS — Z862 Personal history of diseases of the blood and blood-forming organs and certain disorders involving the immune mechanism: Secondary | ICD-10-CM | POA: Diagnosis not present

## 2014-08-09 DIAGNOSIS — Z7952 Long term (current) use of systemic steroids: Secondary | ICD-10-CM | POA: Diagnosis not present

## 2014-08-09 DIAGNOSIS — F329 Major depressive disorder, single episode, unspecified: Secondary | ICD-10-CM | POA: Insufficient documentation

## 2014-08-09 DIAGNOSIS — Z87440 Personal history of urinary (tract) infections: Secondary | ICD-10-CM | POA: Insufficient documentation

## 2014-08-09 DIAGNOSIS — R112 Nausea with vomiting, unspecified: Secondary | ICD-10-CM | POA: Insufficient documentation

## 2014-08-09 DIAGNOSIS — Z3202 Encounter for pregnancy test, result negative: Secondary | ICD-10-CM | POA: Insufficient documentation

## 2014-08-09 DIAGNOSIS — Z79899 Other long term (current) drug therapy: Secondary | ICD-10-CM | POA: Insufficient documentation

## 2014-08-09 DIAGNOSIS — Z8719 Personal history of other diseases of the digestive system: Secondary | ICD-10-CM | POA: Diagnosis not present

## 2014-08-09 DIAGNOSIS — F419 Anxiety disorder, unspecified: Secondary | ICD-10-CM | POA: Diagnosis not present

## 2014-08-09 DIAGNOSIS — Z791 Long term (current) use of non-steroidal anti-inflammatories (NSAID): Secondary | ICD-10-CM | POA: Diagnosis not present

## 2014-08-09 DIAGNOSIS — R1084 Generalized abdominal pain: Secondary | ICD-10-CM | POA: Diagnosis not present

## 2014-08-09 LAB — COMPREHENSIVE METABOLIC PANEL
ALT: 14 U/L (ref 0–35)
AST: 18 U/L (ref 0–37)
Albumin: 4.7 g/dL (ref 3.5–5.2)
Alkaline Phosphatase: 56 U/L (ref 39–117)
Anion gap: 7 (ref 5–15)
BUN: 15 mg/dL (ref 6–23)
CO2: 26 mmol/L (ref 19–32)
Calcium: 9.1 mg/dL (ref 8.4–10.5)
Chloride: 103 mmol/L (ref 96–112)
Creatinine, Ser: 0.62 mg/dL (ref 0.50–1.10)
GFR calc Af Amer: >90 mL/min (ref >90)
GFR calc non Af Amer: >90 mL/min (ref >90)
Glucose, Bld: 106 mg/dL — ABNORMAL HIGH (ref 70–99)
Potassium: 3.3 mmol/L — ABNORMAL LOW (ref 3.5–5.1)
Sodium: 136 mmol/L (ref 135–145)
Total Bilirubin: 0.3 mg/dL (ref 0.3–1.2)
Total Protein: 8.6 g/dL — ABNORMAL HIGH (ref 6.0–8.3)

## 2014-08-09 LAB — CBC WITH DIFFERENTIAL/PLATELET
Basophils Absolute: 0 10*3/uL (ref 0.0–0.1)
Basophils Relative: 0 % (ref 0–1)
Eosinophils Absolute: 0.1 10*3/uL (ref 0.0–0.7)
Eosinophils Relative: 2 % (ref 0–5)
HCT: 37.1 % (ref 36.0–46.0)
Hemoglobin: 12.4 g/dL (ref 12.0–15.0)
Lymphocytes Relative: 38 % (ref 12–46)
Lymphs Abs: 1.8 10*3/uL (ref 0.7–4.0)
MCH: 31.3 pg (ref 26.0–34.0)
MCHC: 33.4 g/dL (ref 30.0–36.0)
MCV: 93.7 fL (ref 78.0–100.0)
Monocytes Absolute: 0.4 10*3/uL (ref 0.1–1.0)
Monocytes Relative: 8 % (ref 3–12)
Neutro Abs: 2.5 10*3/uL (ref 1.7–7.7)
Neutrophils Relative %: 52 % (ref 43–77)
Platelets: 228 10*3/uL (ref 150–400)
RBC: 3.96 MIL/uL (ref 3.87–5.11)
RDW: 12.8 % (ref 11.5–15.5)
WBC: 4.7 10*3/uL (ref 4.0–10.5)

## 2014-08-09 LAB — URINALYSIS, ROUTINE W REFLEX MICROSCOPIC
Bilirubin Urine: NEGATIVE
Glucose, UA: NEGATIVE mg/dL
Ketones, ur: NEGATIVE mg/dL
Nitrite: NEGATIVE
Protein, ur: 30 mg/dL — AB
Specific Gravity, Urine: 1.028 (ref 1.005–1.030)
Urobilinogen, UA: 1 mg/dL (ref 0.0–1.0)
pH: 7 (ref 5.0–8.0)

## 2014-08-09 LAB — URINE MICROSCOPIC-ADD ON

## 2014-08-09 LAB — POC URINE PREG, ED: Preg Test, Ur: NEGATIVE

## 2014-08-09 LAB — LIPASE, BLOOD: Lipase: 24 U/L (ref 11–59)

## 2014-08-09 LAB — PATHOLOGIST SMEAR REVIEW

## 2014-08-09 MED ORDER — NITROFURANTOIN MONOHYD MACRO 100 MG PO CAPS
100.0000 mg | ORAL_CAPSULE | Freq: Once | ORAL | Status: DC
  Filled 2014-08-09: qty 1

## 2014-08-09 MED ORDER — LIDOCAINE HCL 1 % IJ SOLN
INTRAMUSCULAR | Status: AC
  Administered 2014-08-09: 20 mL
  Filled 2014-08-09: qty 20

## 2014-08-09 MED ORDER — CEFTRIAXONE SODIUM 1 G IJ SOLR
1.0000 g | Freq: Once | INTRAMUSCULAR | Status: AC
  Administered 2014-08-09: 1 g via INTRAMUSCULAR
  Filled 2014-08-09: qty 10

## 2014-08-09 MED ORDER — CEFTRIAXONE SODIUM 1 G IJ SOLR: 1.0000 g | Freq: Once | INTRAMUSCULAR | Status: DC

## 2014-08-09 MED ORDER — ONDANSETRON HCL 4 MG PO TABS: 4.0000 mg | ORAL_TABLET | Freq: Four times a day (QID) | ORAL | Status: DC

## 2014-08-09 MED ORDER — NITROFURANTOIN MONOHYD MACRO 100 MG PO CAPS: 100.0000 mg | ORAL_CAPSULE | Freq: Two times a day (BID) | ORAL | Status: DC

## 2014-08-09 MED ORDER — LIDOCAINE HCL 1 % IJ SOLN
20.0000 mL | Freq: Once | INTRAMUSCULAR | Status: AC
  Administered 2014-08-09: 20 mL

## 2014-08-09 MED ORDER — ONDANSETRON HCL 4 MG/2ML IJ SOLN
4.0000 mg | Freq: Once | INTRAMUSCULAR | Status: AC
  Administered 2014-08-09: 4 mg via INTRAVENOUS
  Filled 2014-08-09: qty 2

## 2014-08-09 NOTE — Discharge Instructions (Signed)
Please use Zofran and antibiotics as directed, complete the entire course. Monitor for worsening signs or symptoms of urinary tract infection and follow-up with home health and wellness for reevaluation of symptoms in 3 days or sooner if it worsens. Please inform your primary care provider of the results of your recent ED visit including cysteine crystals that were noted on your urinalysis. Have attached information for follow-up evaluation of 16 crystals with [place] kidney.

## 2014-08-09 NOTE — ED Provider Notes (Signed)
CSN: [id number]     Arrival date & time 08/09/14  1148 History   First MD Initiated Contact with Patient 08/09/14 1246     Chief Complaint  Patient presents with  . Emesis   HPI   29 year old female presents with nausea vomiting. Patient poor she was seen 3 days ago for urinary tract infection and given Cipro. She reports at that time she was nauseous and was given Zofran at the urgent care but was not prescribed a prescription to take home. He said over the last 2 days she is taking total 2 pills as she has not been able hold it down due to the nausea and vomiting. She denies urinary symptoms with the exception of urinary frequency. She reports previous urinary infections did not present typically and started with urinary frequency followed by blood in her urine. This episode started with urinary frequency no other symptoms, including headache, fever, back pain, discharge, painful urinations. Patient reports that she also suffers from irritable bowel syndrome and has been intermittently constipated which she indicates could be adding to her nausea. Patient reports mild intermittent abdominal pain as a result of her irritable bowel with no increase over her baseline. Patient recently had a urinary pregnancy test was negative no sex since, also had STD testing at her recent OB/GYN appointment with negative results. Patient reports she would have followed up with urgent care but noted that on the last visit they stated if she could not tolerate by mouth medication she would need to be seen in the emergency room. Patient reports infrequent urinary tract infections with the last one approximately a year ago that was treated with a unknown medication that did not cause nausea or vomiting.  Past Medical History  Diagnosis Date  . IBS (irritable bowel syndrome)   . Allergy   . Anemia   . Anxiety   . Depression    History reviewed. No pertinent past surgical history. Family History  Problem Relation Age  of Onset  . Cancer Mother   . Diabetes Mother    History  Substance Use Topics  . Smoking status: Never Smoker   . Smokeless tobacco: Not on file  . Alcohol Use: No   OB History    No data available     Review of Systems  All other systems reviewed and are negative.   Allergies  Other; Ketamine; and Latex  Home Medications   Prior to Admission medications   Medication Sig Start Date End Date Taking? Authorizing Provider  Benzoyl Peroxide ([employer] CREAMY ACNE FACE WASH EX) Apply 1 application topically at bedtime as needed (rashes).   Yes Historical Provider, MD  levonorgestrel-ethinyl estradiol (AUBRA) 0.1-20 MG-MCG tablet Take 1 tablet by mouth at bedtime.   Yes Historical Provider, MD  cetirizine (ZYRTEC) 10 MG tablet Take 1 tablet (10 mg total) by mouth daily. 07/10/14   Jonita Albee, MD  clobetasol cream (TEMOVATE) 0.05 % Apply 1 application topically 2 (two) times daily. Do not use on face or genitals 07/10/14   Jonita Albee, MD  diclofenac sodium (VOLTAREN) 1 % GEL Apply 2 g topically 4 (four) times daily. 08/06/14   Collie Siad English, PA  FLUoxetine (PROZAC) 10 MG capsule Take 1 capsule (10 mg total) by mouth daily. 1 PO QD FOR 1 WEEK, THEN 2 PO QD 08/08/14   Collie Siad English, PA  Linaclotide (LINZESS) 290 MCG CAPS capsule Take 1 capsule (290 mcg total) by mouth daily. 08/06/14   Judeth Cornfield  D English, PA  meloxicam (MOBIC) 15 MG tablet Take 0.5-1 tablets (7.5-15 mg total) by mouth daily. 08/08/14   Collie SiadStephanie D English, PA  omeprazole (PRILOSEC) 20 MG capsule Take 1 capsule (20 mg total) by mouth daily. 08/06/14   Collie SiadStephanie D English, PA   BP 136/89 mmHg  Pulse 72  Temp(Src) 98 F (36.7 C) (Oral)  Resp 18  Ht 5\' 1"  (1.549 m)  Wt 120 lb (54.432 kg)  BMI 22.69 kg/m2  SpO2 99% Physical Exam  Constitutional: She is oriented to person, place, and time. She appears well-developed and well-nourished.  HENT:  Head: Normocephalic and atraumatic.  Eyes: Pupils are equal, round,  and reactive to light.  Neck: Normal range of motion. Neck supple. No JVD present. No tracheal deviation present. No thyromegaly present.  Cardiovascular: Normal rate, regular rhythm, normal heart sounds and intact distal pulses.  Exam reveals no gallop and no friction rub.   No murmur heard. Pulmonary/Chest: Effort normal and breath sounds normal. No stridor. No respiratory distress. She has no wheezes. She has no rales. She exhibits no tenderness.  Abdominal: There is no hepatosplenomegaly, splenomegaly or hepatomegaly. There is no rigidity, no rebound, no guarding, no CVA tenderness, no tenderness at McBurney's point and negative Murphy's sign. No hernia.  Mild diffuse abdominal discomfort with palpation no acute tenderness  Musculoskeletal: Normal range of motion.  Lymphadenopathy:    She has no cervical adenopathy.  Neurological: She is alert and oriented to person, place, and time. Coordination normal.  Skin: Skin is warm and dry.  Psychiatric: She has a normal mood and affect. Her behavior is normal. Judgment and thought content normal.  Nursing note and vitals reviewed.   ED Course  Procedures (including critical care time) Labs Review Labs Reviewed  COMPREHENSIVE METABOLIC PANEL - Abnormal; Notable for the following:    Potassium 3.3 (*)    Glucose, Bld 106 (*)    Total Protein 8.6 (*)    All other components within normal limits  CBC WITH DIFFERENTIAL/PLATELET  LIPASE, BLOOD  URINALYSIS, ROUTINE W REFLEX MICROSCOPIC  POC URINE PREG, ED    Imaging Review No results found.   EKG Interpretation None      MDM   Final diagnoses:  Nausea and vomiting, vomiting of unspecified type   Labs: Pregnancy, CBC, CMP, lipase, urinalysis- significant for small amount of leukocytes cloudy appearance-  Cysteine crystals  Imaging: None indicated  Consults: None indicated  Therapeutics: Macrobid, Zofran  Assessment: Urinary tract infection with associated nausea and  vomiting  Plan: Patient reports that she is not able to tolerate by mouth medication and would not attempt by mouth antibiotic in the emergency room. She requested IM/IV antibiotics for the treatment of her urinary tract infection. She is encouraged to attempt oral intake here in an attempt to assure adequate outpatient administration. Patient became agitated and refused. She denies urinary complaints in addition to the frequency, no fever, CVA tenderness. She reports the nausea is caused by her irritable bowel syndrome and worsened by urinary tract infections. Patient was given a gram of Rocephin IM and discharged home with Macrobid twice a day for 5 days. She was instructed to discontinue her Cipro as she only taken 2 doses up until this point, Macrobid has recovered for Escherichia coli in our area. Pathologist called regarding her cysteine crystals found in her urine and the importance of follow-up evaluation for kidney stones. Patient was offered further diagnostic imaging during her stay here perfused and reported she  would follow up with her primary care for further evaluation. Contact information for [facility] for further evaluation and again importance of follow-up was stressed with her. She is discharged home with her Macrobid, and Zofran as needed for the nausea. Patient was encouraged to follow with primary care in 3 days for reevaluation of her symptoms. Dr. to return if symptoms worsen or she was unable to tolerate by mouth medication. She understood and agreed to this plan.     Eyvonne Mechanic, PA-C 08/09/14 1607  Rolland Porter, MD 08/10/14 [phone]

## 2014-08-09 NOTE — ED Notes (Addendum)
Pt dx with uti and constipation 3 days ago given antibiotic and laxative. Today continues to have n/v. Denies any other complaints at this time. Pt states improvement in urinary symptoms and LBM today. Pt also c/o abd pain with bloating.

## 2014-08-10 NOTE — Telephone Encounter (Signed)
Voltaren gel denied by [employer].

## 2014-08-29 ENCOUNTER — Ambulatory Visit (INDEPENDENT_AMBULATORY_CARE_PROVIDER_SITE_OTHER): Payer: BLUE CROSS/BLUE SHIELD | Admitting: Physician Assistant

## 2014-08-29 ENCOUNTER — Encounter: Payer: Self-pay | Admitting: Physician Assistant

## 2014-08-29 VITALS — BP 130/91 | HR 85 | Temp 99.0°F | Resp 16 | Ht 60.5 in | Wt 115.6 lb

## 2014-08-29 DIAGNOSIS — F411 Generalized anxiety disorder: Secondary | ICD-10-CM

## 2014-08-29 DIAGNOSIS — K589 Irritable bowel syndrome without diarrhea: Secondary | ICD-10-CM | POA: Diagnosis not present

## 2014-08-29 DIAGNOSIS — K219 Gastro-esophageal reflux disease without esophagitis: Secondary | ICD-10-CM | POA: Diagnosis not present

## 2014-08-29 DIAGNOSIS — R1084 Generalized abdominal pain: Secondary | ICD-10-CM | POA: Diagnosis not present

## 2014-08-29 MED ORDER — OMEPRAZOLE 20 MG PO CPDR: 20.0000 mg | DELAYED_RELEASE_CAPSULE | Freq: Every day | ORAL | Status: DC

## 2014-08-29 MED ORDER — RANITIDINE HCL 150 MG PO TABS: 150.0000 mg | ORAL_TABLET | Freq: Two times a day (BID) | ORAL | Status: DC

## 2014-08-29 MED ORDER — ESCITALOPRAM OXALATE 10 MG PO TABS: 10.0000 mg | ORAL_TABLET | Freq: Every day | ORAL | Status: DC

## 2014-08-29 NOTE — Patient Instructions (Addendum)
Start taking the linzess other day or every two days of the Linzess. Start lexapro for anxiety at a half tablet the first week, then increase to one full tablet.  Then I will see you to see how this is going.  Start taking the ranitidine, and decrease the prilosec back to one for the reflux.   Cut the mobic down to a 1/2 tablet for the foot pain.   Dr. Laymond PurserPerrin is the psychologist  At [facility] [address], [place], [place] [address]  [phone].  Please let me know if you need the referral.

## 2014-09-03 NOTE — Progress Notes (Signed)
[facility] [address], [place] [address] [phone]  Date:  08/29/2014   Name:  Patricia Whitehead   DOB:  Jul 28, 1985   MRN:  [id number]  PCP:  No PCP Per Patient    Chief Complaint: Follow-up; Fatigue; and Irritable Bowel Syndrome   History of Present Illness:  Patricia Whitehead is a 29 y.o. very pleasant female patient who presents with the following:  Patient states that her IBS is still acting up.  She continues to have the abdominal pain and nausea.  The zofran is helping to combat this.  The linzess was working until Monday, 2 days ago, when the diarrhea started.  She has no fever or chills, or blood in the stool.  Her urination is normal without hematuria, dysuria, or frequency.  She has been eating jello and soup daily.  She takes a probiotic daily.  She has not contacted anyone for psychotherapy stating that she can not miss work.    Patient discontinued taking the prozac, as she felt she was not tolerating the medication well.     Patient Active Problem List   Diagnosis Date Noted  . NAUSEA 10/23/2008  . DYSPEPSIA 09/12/2008  . ABDOMINAL PAIN RIGHT UPPER QUADRANT 07/20/2007    Past Medical History  Diagnosis Date  . IBS (irritable bowel syndrome)   . Allergy   . Anemia   . Anxiety   . Depression     No past surgical history on file.  History  Substance Use Topics  . Smoking status: Never Smoker   . Smokeless tobacco: Not on file  . Alcohol Use: No    Family History  Problem Relation Age of Onset  . Cancer Mother   . Diabetes Mother     Allergies  Allergen Reactions  . Other Swelling    Muscle relaxer's   . Ketamine Other (See Comments)    seizures  . Latex Itching and Rash    Medication list has been reviewed and updated.  Current Outpatient Prescriptions on File Prior to Visit  Medication Sig Dispense Refill  . levonorgestrel-ethinyl estradiol (AUBRA) 0.1-20 MG-MCG tablet Take 1 tablet by mouth at bedtime.    Marland Kitchen omeprazole  (PRILOSEC) 20 MG capsule Take 1 capsule (20 mg total) by mouth daily. 90 capsule 1  . ondansetron (ZOFRAN) 4 MG tablet Take 1 tablet (4 mg total) by mouth every 6 (six) hours. 12 tablet 0  . Benzoyl Peroxide ([employer]S CREAMY ACNE FACE WASH EX) Apply 1 application topically at bedtime as needed (rashes).    . cetirizine (ZYRTEC) 10 MG tablet Take 1 tablet (10 mg total) by mouth daily. (Patient not taking: Reported on 08/29/2014) 30 tablet 11  . clobetasol cream (TEMOVATE) 0.05 % Apply 1 application topically 2 (two) times daily. Do not use on face or genitals (Patient not taking: Reported on 08/29/2014) 30 g 0  . diclofenac sodium (VOLTAREN) 1 % GEL Apply 2 g topically 4 (four) times daily. (Patient not taking: Reported on 08/29/2014) 100 g 1  . FLUoxetine (PROZAC) 10 MG capsule Take 1 capsule (10 mg total) by mouth daily. 1 PO QD FOR 1 WEEK, THEN 2 PO QD (Patient not taking: Reported on 08/29/2014) 100 capsule 0  . Linaclotide (LINZESS) 290 MCG CAPS capsule Take 1 capsule (290 mcg total) by mouth daily. (Patient not taking: Reported on 08/29/2014) 30 capsule 2  . meloxicam (MOBIC) 15 MG tablet Take 0.5-1 tablets (7.5-15 mg total) by mouth daily. (Patient not taking:  Reported on 08/29/2014) 30 tablet 1  . nitrofurantoin, macrocrystal-monohydrate, (MACROBID) 100 MG capsule Take 1 capsule (100 mg total) by mouth 2 (two) times daily. (Patient not taking: Reported on 08/29/2014) 10 capsule 0   No current facility-administered medications on file prior to visit.    Review of Systems: ROS otherwise unremarkable unless listed above.  Physical Examination: Filed Vitals:   08/29/14 1405  BP: 130/91  Pulse: 85  Temp: 99 F (37.2 C)  Resp: 16   Filed Vitals:   08/29/14 1405  Height: 5' 0.5" (1.537 m)  Weight: 115 lb 9.6 oz (52.436 kg)   Body mass index is 22.2 kg/(m^2). Ideal Body Weight: Weight in (lb) to have BMI = 25: 129.9 Wt Readings from Last 3 Encounters:  08/29/14 115 lb 9.6 oz (52.436 kg)  08/09/14 120  lb (54.432 kg)  08/06/14 120 lb (54.432 kg)    Physical Exam  Constitutional: She is oriented to person, place, and time. She appears well-developed and well-nourished. No distress.  HENT:  Head: Normocephalic and atraumatic.  Eyes: EOM are normal. Pupils are equal, round, and reactive to light.  Cardiovascular: Normal rate, regular rhythm, normal heart sounds and intact distal pulses.  Exam reveals no friction rub.   No murmur heard. Pulmonary/Chest: Effort normal and breath sounds normal. No respiratory distress. She has no wheezes.  Abdominal: Soft. Bowel sounds are normal. She exhibits no distension and no mass. There is tenderness (Diffused mild tenderness in all quadrants.). There is no guarding.  Musculoskeletal: Normal range of motion. She exhibits no edema.  Neurological: She is alert and oriented to person, place, and time. No cranial nerve deficit. Coordination normal.  Skin: Skin is warm and dry. She is not diaphoretic.  Psychiatric: She has a normal mood and affect. Her behavior is normal.     Assessment and Plan: 29 year old female is here today for followup of her anxiety.    Gastroesophageal reflux disease, esophagitis presence not specified -Take one tablet of the prilosec qd, and take ranitidine bid. -cut mobic down to 1/2 tablet qd  - Plan: ranitidine (ZANTAC) 150 MG tablet  Generalized abdominal pain -  See GERD and IBS plan Plan: omeprazole (PRILOSEC) 20 MG capsule -Also instructed patient on FODMAP.  Handout given, so that she can increase her intake.    Anxiety state - Plan: escitalopram (LEXAPRO) 10 MG tablet -4-6 weeks recheck after lexapro titrated to one full 10mg  tablet qd.  She will contact Dr. Laymond Purser, once she gets her FMLA together.  I have advised her to come to appointments for the hour, and return to work.  She should not miss a full day for an appointment, as done today.  Spent time discussing IBS, and the importance of cognitive therapy in  combating these symptoms.    IBS (irritable bowel syndrome) -Restart the linzess to every other day to every 2 days.  Trena Platt, PA-C [facility] 5/9/20166:20 PM

## 2014-09-04 ENCOUNTER — Telehealth: Payer: Self-pay

## 2014-09-04 NOTE — Telephone Encounter (Signed)
PA approved for voltaren gel through 04/26/2038. Notified pharm and asked them to contact pt when ready. Sherlynn CarbonStephanie, FYI.

## 2014-10-11 ENCOUNTER — Ambulatory Visit (INDEPENDENT_AMBULATORY_CARE_PROVIDER_SITE_OTHER): Payer: BLUE CROSS/BLUE SHIELD | Admitting: Physician Assistant

## 2014-10-11 ENCOUNTER — Encounter: Payer: Self-pay | Admitting: Physician Assistant

## 2014-10-11 VITALS — BP 124/77 | HR 84 | Temp 98.2°F | Resp 16 | Ht 60.25 in | Wt 116.0 lb

## 2014-10-11 DIAGNOSIS — K589 Irritable bowel syndrome without diarrhea: Secondary | ICD-10-CM

## 2014-10-11 DIAGNOSIS — F411 Generalized anxiety disorder: Secondary | ICD-10-CM | POA: Diagnosis not present

## 2014-10-11 DIAGNOSIS — R1084 Generalized abdominal pain: Secondary | ICD-10-CM

## 2014-10-11 NOTE — Patient Instructions (Signed)
Everything sounds like it is improving

## 2014-10-17 NOTE — Progress Notes (Signed)
[facility] [address] [place] [address] [phone]  Date:  10/11/2014   Name:  Patricia Whitehead   DOB:  12-28-1985   MRN:  [id number]  PCP:  No PCP Per Patient    History of Present Illness:  Patricia Whitehead is a 29 y.o. female patient who presents to [facility] for follow up of her abdominal pain, and depression/anxiety. She states that this has improved greatly.  She continues to have to watch what she is eating, to not start instant abdominal pain.  She uses the linzess for about 2-3 days and then stops for about 5-6 days.  Taking it every other day was too much and would cause diarrhea.  Waiting every 2-3 days would not make much of a difference when she attempted this.   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  She is sleeping much better.  She is generally compliant on the lexapro.  She uses 1 tablet and feels that this is appears to help and she does not want to increase dosage at this time.  She denies al si/hi ideations.  She feels that she is less tired though this still continues.   She has not picked up the voltaren gel, though she states that it has improved with pain.  She did not know that the pharmacy may have a PA of this med, and will check to see if this is available.         Patient Active Problem List   Diagnosis Date Noted  . NAUSEA 10/23/2008  . DYSPEPSIA 09/12/2008  . ABDOMINAL PAIN RIGHT UPPER QUADRANT 07/20/2007    Past Medical History  Diagnosis Date  . IBS (irritable bowel syndrome)   . Allergy   . Anemia   . Anxiety   . Depression     No past surgical history on file.  History  Substance Use Topics  . Smoking status: Never Smoker   . Smokeless tobacco: Not on file  . Alcohol Use: No    Family History  Problem Relation Age of Onset  . Cancer Mother   . Diabetes Mother     Allergies  Allergen Reactions  . Other Swelling    Muscle  relaxer's   . Ketamine Other (See Comments)    seizures  . Latex Itching and Rash    Medication list has been reviewed and updated.  Current Outpatient Prescriptions on File Prior to Visit  Medication Sig Dispense Refill  . cetirizine (ZYRTEC) 10 MG tablet Take 1 tablet (10 mg total) by mouth daily. 30 tablet 11  . clobetasol cream (TEMOVATE) 0.05 % Apply 1 application topically 2 (two) times daily. Do not use on face or genitals 30 g 0  . escitalopram (LEXAPRO) 10 MG tablet Take 1 tablet (10 mg total) by mouth daily. 60 tablet 1  . FLUoxetine (PROZAC) 10 MG capsule Take 1 capsule (10 mg total) by mouth daily. 1 PO QD FOR 1 WEEK, THEN 2 PO QD 100 capsule 0  . levonorgestrel-ethinyl estradiol (AUBRA) 0.1-20 MG-MCG tablet Take 1 tablet by mouth at bedtime.    . Linaclotide (LINZESS) 290 MCG CAPS capsule Take 1 capsule (290 mcg total) by mouth daily. 30 capsule 2  . omeprazole (PRILOSEC) 20 MG capsule Take 1 capsule (20 mg total) by mouth daily. 90 capsule 1  . ranitidine (ZANTAC) 150 MG tablet Take 1 tablet (150 mg total)  by mouth 2 (two) times daily. 60 tablet 5  . diclofenac sodium (VOLTAREN) 1 % GEL Apply 2 g topically 4 (four) times daily. (Patient not taking: Reported on 08/29/2014) 100 g 1  . meloxicam (MOBIC) 15 MG tablet Take 0.5-1 tablets (7.5-15 mg total) by mouth daily. (Patient not taking: Reported on 08/29/2014) 30 tablet 1  . nitrofurantoin, macrocrystal-monohydrate, (MACROBID) 100 MG capsule Take 1 capsule (100 mg total) by mouth 2 (two) times daily. (Patient not taking: Reported on 08/29/2014) 10 capsule 0  . ondansetron (ZOFRAN) 4 MG tablet Take 1 tablet (4 mg total) by mouth every 6 (six) hours. (Patient not taking: Reported on 10/11/2014) 12 tablet 0   No current facility-administered medications on file prior to visit.    ROS ROS otherwise unremarkable unless listed above.  Physical Examination: BP 124/77 mmHg  Pulse 84  Temp(Src) 98.2 F (36.8 C) (Oral)  Resp 16  Ht 5'  0.25" (1.53 m)  Wt 116 lb (52.617 kg)  BMI 22.48 kg/m2  SpO2 96% Ideal Body Weight: Weight in (lb) to have BMI = 25: 128.8  Physical Exam  Constitutional: She appears well-developed and well-nourished. No distress.  HENT:  Head: Normocephalic.  Eyes: EOM are normal. Pupils are equal, round, and reactive to light. Right eye exhibits no discharge. Left eye exhibits no discharge.  Neck: Normal range of motion.  Cardiovascular: Normal rate and regular rhythm.  Exam reveals no friction rub.   No murmur heard. Pulmonary/Chest: Effort normal and breath sounds normal. No respiratory distress.  Abdominal: Soft. Normal appearance. Bowel sounds are increased. There is no hepatosplenomegaly. There is tenderness (mild left quadrant). There is no CVA tenderness, no tenderness at McBurney's point and negative Murphy's sign.  Musculoskeletal: She exhibits no edema or tenderness.  Skin: She is not diaphoretic.  Psychiatric: She has a normal mood and affect. Her behavior is normal.     Assessment and Plan: 29 year old female is here today with PMH listed above, that is here for follow up of ibs and anxiety.  Both are improving both in history and physical.  I have advised her again, to contact the psychologist for evaluation, and likely therapy.  She will continue the linzess at her regimen and lexapro.  She declines a gastroenterology referral at this time.  She will return in 6 months for follow up.      1. Generalized abdominal pain 2. IBS (irritable bowel syndrome) Stable, continue with the linzess.  rtc in 6 months  3. Anxiety state - escitalopram (LEXAPRO) 10 MG tablet; Take 1 tablet (10 mg total) by mouth daily.  Dispense: 60 tablet   Patricia Platt, PA-C [facility] 10/17/2014 11:50 AM

## 2014-11-06 ENCOUNTER — Ambulatory Visit (INDEPENDENT_AMBULATORY_CARE_PROVIDER_SITE_OTHER): Payer: BLUE CROSS/BLUE SHIELD | Admitting: Physician Assistant

## 2014-11-06 VITALS — BP 120/80 | HR 68 | Temp 98.0°F | Resp 16 | Ht 61.0 in | Wt 121.0 lb

## 2014-11-06 DIAGNOSIS — M545 Low back pain, unspecified: Secondary | ICD-10-CM

## 2014-11-06 MED ORDER — CYCLOBENZAPRINE HCL 10 MG PO TABS: 10.0000 mg | ORAL_TABLET | Freq: Three times a day (TID) | ORAL | Status: DC | PRN

## 2014-11-06 MED ORDER — MELOXICAM 15 MG PO TABS: 15.0000 mg | ORAL_TABLET | Freq: Every day | ORAL | Status: DC

## 2014-11-06 MED ORDER — HYDROCODONE-ACETAMINOPHEN 5-325 MG PO TABS: 1.0000 | ORAL_TABLET | Freq: Four times a day (QID) | ORAL | Status: DC | PRN

## 2014-11-06 NOTE — Patient Instructions (Signed)

## 2014-11-06 NOTE — Progress Notes (Signed)
Subjective:    Patient ID: Patricia Whitehead, female    DOB: 1985-08-30, 30 y.o.   MRN: [id number]  HPI Patient presents for lower back pain that started yesterday. Was standing up and felt pain in back. Pain is constant and worse every time she stands. Pain does not radiate and denies incontinence, numbness, loss of sensation/fxn/ROM, weakness, or gait change. Denies urinary sx and has h/o IBS-C, but taking Linzess and BMs are watery lately. Denies trauma, falls, or increased exercise. Has taken 800 mg ibuprofen without change in pain level.   Review of Systems  Constitutional: Negative.   Musculoskeletal: Positive for back pain. Negative for myalgias, joint swelling, arthralgias, gait problem, neck pain and neck stiffness.  Neurological: Negative.  Negative for dizziness and numbness.       Objective:   Physical Exam  Constitutional: She is oriented to person, place, and time. She appears well-developed and well-nourished. No distress.  Blood pressure 120/80, pulse 68, temperature 98 F (36.7 C), temperature source Oral, resp. rate 16, height 5\' 1"  (1.549 m), weight 121 lb (54.885 kg), SpO2 99 %.  HENT:  Head: Normocephalic and atraumatic.  Right Ear: External ear normal.  Left Ear: External ear normal.  Eyes: Conjunctivae and EOM are normal. Pupils are equal, round, and reactive to light. Right eye exhibits no discharge. Left eye exhibits no discharge. No scleral icterus.  Neck: Normal range of motion. Neck supple. No thyromegaly present.  Pulmonary/Chest: Effort normal.  Musculoskeletal: Normal range of motion. She exhibits tenderness. She exhibits no edema.       Cervical back: Normal.       Thoracic back: Normal.       Lumbar back: She exhibits tenderness and pain. She exhibits normal range of motion, no bony tenderness, no swelling, no edema, no deformity and no laceration.  Lymphadenopathy:    She has no cervical adenopathy.  Neurological: She is alert and oriented to person,  place, and time. She has normal strength and normal reflexes. No cranial nerve deficit or sensory deficit. She exhibits normal muscle tone. Coordination normal.  Skin: Skin is warm and dry. No rash noted. She is not diaphoretic. No erythema. No pallor.  Psychiatric: She has a normal mood and affect. Her behavior is normal. Judgment and thought content normal.       Assessment & Plan:  1. Bilateral low back pain without sciatica Can alternate ice pack and heating pad. Back exercises given. - cyclobenzaprine (FLEXERIL) 10 MG tablet; Take 1 tablet (10 mg total) by mouth 3 (three) times daily as needed for muscle spasms.  Dispense: 30 tablet; Refill: 0 - meloxicam (MOBIC) 15 MG tablet; Take 1 tablet (15 mg total) by mouth daily.  Dispense: 30 tablet; Refill: 1 - HYDROcodone-acetaminophen (NORCO) 5-325 MG per tablet; Take 1 tablet by mouth every 6 (six) hours as needed.  Dispense: 30 tablet; Refill: 0   Emmry Hinsch PA-C  [facility] 11/06/2014 2:35 PM

## 2014-11-27 ENCOUNTER — Other Ambulatory Visit: Payer: Self-pay | Admitting: Physician Assistant

## 2014-11-28 ENCOUNTER — Ambulatory Visit (INDEPENDENT_AMBULATORY_CARE_PROVIDER_SITE_OTHER): Payer: BLUE CROSS/BLUE SHIELD | Admitting: Physician Assistant

## 2014-11-28 VITALS — BP 118/78 | HR 86 | Temp 98.8°F | Resp 18 | Ht 61.0 in | Wt 120.0 lb

## 2014-11-28 DIAGNOSIS — R1084 Generalized abdominal pain: Secondary | ICD-10-CM | POA: Diagnosis not present

## 2014-11-28 DIAGNOSIS — R059 Cough, unspecified: Secondary | ICD-10-CM

## 2014-11-28 DIAGNOSIS — R05 Cough: Secondary | ICD-10-CM

## 2014-11-28 DIAGNOSIS — K589 Irritable bowel syndrome without diarrhea: Secondary | ICD-10-CM | POA: Diagnosis not present

## 2014-11-28 MED ORDER — ESCITALOPRAM OXALATE 10 MG PO TABS: ORAL_TABLET | ORAL | Status: DC

## 2014-11-28 MED ORDER — HYDROCOD POLST-CPM POLST ER 10-8 MG/5ML PO SUER: 5.0000 mL | Freq: Every evening | ORAL | Status: AC | PRN

## 2014-11-28 MED ORDER — BENZONATATE 100 MG PO CAPS: 100.0000 mg | ORAL_CAPSULE | Freq: Three times a day (TID) | ORAL | Status: DC | PRN

## 2014-11-28 MED ORDER — LINACLOTIDE 290 MCG PO CAPS: 290.0000 ug | ORAL_CAPSULE | Freq: Every day | ORAL | Status: DC

## 2014-11-28 NOTE — Patient Instructions (Addendum)
-I will see you in 6 months, if this throat fully recovers.     Dr. Laymond Purser is the psychologist At [facility] [address], [place], [place] [address]  [phone]. Please let me know if you need the referral.  Upper Respiratory Infection, Adult An upper respiratory infection (URI) is also sometimes known as the common cold. The upper respiratory tract includes the nose, sinuses, throat, trachea, and bronchi. Bronchi are the airways leading to the lungs. Most people improve within 1 week, but symptoms can last up to 2 weeks. A residual cough may last even longer.  CAUSES Many different viruses can infect the tissues lining the upper respiratory tract. The tissues become irritated and inflamed and often become very moist. Mucus production is also common. A cold is contagious. You can easily spread the virus to others by oral contact. This includes kissing, sharing a glass, coughing, or sneezing. Touching your mouth or nose and then touching a surface, which is then touched by another person, can also spread the virus. SYMPTOMS  Symptoms typically develop 1 to 3 days after you come in contact with a cold virus. Symptoms vary from person to person. They may include:  Runny nose.  Sneezing.  Nasal congestion.  Sinus irritation.  Sore throat.  Loss of voice (laryngitis).  Cough.  Fatigue.  Muscle aches.  Loss of appetite.  Headache.  Low-grade fever. DIAGNOSIS  You might diagnose your own cold based on familiar symptoms, since most people get a cold 2 to 3 times a year. Your caregiver can confirm this based on your exam. Most importantly, your caregiver can check that your symptoms are not due to another disease such as strep throat, sinusitis, pneumonia, asthma, or epiglottitis. Blood tests, throat tests, and X-rays are not necessary to diagnose a common cold, but they may sometimes be helpful in excluding other more serious diseases. Your caregiver will decide if any  further tests are required. RISKS AND COMPLICATIONS  You may be at risk for a more severe case of the common cold if you smoke cigarettes, have chronic heart disease (such as heart failure) or lung disease (such as asthma), or if you have a weakened immune system. The very young and very old are also at risk for more serious infections. Bacterial sinusitis, middle ear infections, and bacterial pneumonia can complicate the common cold. The common cold can worsen asthma and chronic obstructive pulmonary disease (COPD). Sometimes, these complications can require emergency medical care and may be life-threatening. PREVENTION  The best way to protect against getting a cold is to practice good hygiene. Avoid oral or hand contact with people with cold symptoms. Wash your hands often if contact occurs. There is no clear evidence that vitamin C, vitamin E, echinacea, or exercise reduces the chance of developing a cold. However, it is always recommended to get plenty of rest and practice good nutrition. TREATMENT  Treatment is directed at relieving symptoms. There is no cure. Antibiotics are not effective, because the infection is caused by a virus, not by bacteria. Treatment may include:  Increased fluid intake. Sports drinks offer valuable electrolytes, sugars, and fluids.  Breathing heated mist or steam (vaporizer or shower).  Eating chicken soup or other clear broths, and maintaining good nutrition.  Getting plenty of rest.  Using gargles or lozenges for comfort.  Controlling fevers with ibuprofen or acetaminophen as directed by your caregiver.  Increasing usage of your inhaler if you have asthma. Zinc gel and zinc lozenges, taken  in the first 24 hours of the common cold, can shorten the duration and lessen the severity of symptoms. Pain medicines may help with fever, muscle aches, and throat pain. A variety of non-prescription medicines are available to treat congestion and runny nose. Your caregiver  can make recommendations and may suggest nasal or lung inhalers for other symptoms.  HOME CARE INSTRUCTIONS   Only take over-the-counter or prescription medicines for pain, discomfort, or fever as directed by your caregiver.  Use a warm mist humidifier or inhale steam from a shower to increase air moisture. This may keep secretions moist and make it easier to breathe.  Drink enough water and fluids to keep your urine clear or pale yellow.  Rest as needed.  Return to work when your temperature has returned to normal or as your caregiver advises. You may need to stay home longer to avoid infecting others. You can also use a face mask and careful hand washing to prevent spread of the virus. SEEK MEDICAL CARE IF:   After the first few days, you feel you are getting worse rather than better.  You need your caregiver's advice about medicines to control symptoms.  You develop chills, worsening shortness of breath, or brown or red sputum. These may be signs of pneumonia.  You develop yellow or brown nasal discharge or pain in the face, especially when you bend forward. These may be signs of sinusitis.  You develop a fever, swollen neck glands, pain with swallowing, or white areas in the back of your throat. These may be signs of strep throat. SEEK IMMEDIATE MEDICAL CARE IF:   You have a fever.  You develop severe or persistent headache, ear pain, sinus pain, or chest pain.  You develop wheezing, a prolonged cough, cough up blood, or have a change in your usual mucus (if you have chronic lung disease).  You develop sore muscles or a stiff neck. Document Released: 10/07/2000 Document Revised: 07/06/2011 Document Reviewed: 07/19/2013 [facility] Patient Information 2015 [place], [place]. This information is not intended to replace advice given to you by your health care provider. Make sure you discuss any questions you have with your health care provider.

## 2014-12-06 ENCOUNTER — Encounter: Payer: Self-pay | Admitting: Physician Assistant

## 2014-12-06 ENCOUNTER — Ambulatory Visit (INDEPENDENT_AMBULATORY_CARE_PROVIDER_SITE_OTHER): Payer: BLUE CROSS/BLUE SHIELD | Admitting: Physician Assistant

## 2014-12-06 VITALS — BP 116/72 | HR 76 | Temp 98.8°F | Resp 16 | Ht 60.5 in | Wt 122.2 lb

## 2014-12-06 DIAGNOSIS — R1011 Right upper quadrant pain: Secondary | ICD-10-CM

## 2014-12-06 DIAGNOSIS — R252 Cramp and spasm: Secondary | ICD-10-CM | POA: Diagnosis not present

## 2014-12-06 DIAGNOSIS — G4700 Insomnia, unspecified: Secondary | ICD-10-CM | POA: Diagnosis not present

## 2014-12-06 DIAGNOSIS — K5900 Constipation, unspecified: Secondary | ICD-10-CM | POA: Diagnosis not present

## 2014-12-06 DIAGNOSIS — F419 Anxiety disorder, unspecified: Secondary | ICD-10-CM

## 2014-12-06 DIAGNOSIS — R1084 Generalized abdominal pain: Secondary | ICD-10-CM | POA: Diagnosis not present

## 2014-12-06 DIAGNOSIS — K589 Irritable bowel syndrome without diarrhea: Secondary | ICD-10-CM

## 2014-12-06 LAB — CBC
HCT: 35.9 % — ABNORMAL LOW (ref 36.0–46.0)
Hemoglobin: 12 g/dL (ref 12.0–15.0)
MCH: 31.1 pg (ref 26.0–34.0)
MCHC: 33.4 g/dL (ref 30.0–36.0)
MCV: 93 fL (ref 78.0–100.0)
MPV: 10.1 fL (ref 8.6–12.4)
Platelets: 310 10*3/uL (ref 150–400)
RBC: 3.86 MIL/uL — ABNORMAL LOW (ref 3.87–5.11)
RDW: 13 % (ref 11.5–15.5)
WBC: 6.7 10*3/uL (ref 4.0–10.5)

## 2014-12-06 LAB — POCT URINALYSIS DIPSTICK
Bilirubin, UA: NEGATIVE
Blood, UA: NEGATIVE
Glucose, UA: NEGATIVE
Ketones, UA: NEGATIVE
Nitrite, UA: NEGATIVE
Protein, UA: NEGATIVE
Spec Grav, UA: 1.015
Urobilinogen, UA: 1
pH, UA: 8.5

## 2014-12-06 LAB — COMPLETE METABOLIC PANEL WITH GFR
ALT: 11 U/L (ref 6–29)
AST: 13 U/L (ref 10–30)
Albumin: 3.7 g/dL (ref 3.6–5.1)
Alkaline Phosphatase: 61 U/L (ref 33–115)
BUN: 11 mg/dL (ref 7–25)
CO2: 26 mmol/L (ref 20–31)
Calcium: 8.9 mg/dL (ref 8.6–10.2)
Chloride: 107 mmol/L (ref 98–110)
Creat: 0.58 mg/dL (ref 0.50–1.10)
GFR, Est African American: >89 mL/min (ref >=60)
GFR, Est Non African American: >89 mL/min (ref >=60)
Glucose, Bld: 90 mg/dL (ref 65–99)
Potassium: 4.3 mmol/L (ref 3.5–5.3)
Sodium: 139 mmol/L (ref 135–146)
Total Bilirubin: 0.2 mg/dL (ref 0.2–1.2)
Total Protein: 6.8 g/dL (ref 6.1–8.1)

## 2014-12-06 LAB — POCT UA - MICROSCOPIC ONLY
Casts, Ur, LPF, POC: NEGATIVE
Crystals, Ur, HPF, POC: NEGATIVE
Mucus, UA: NEGATIVE
RBC, urine, microscopic: NEGATIVE
Yeast, UA: NEGATIVE

## 2014-12-06 MED ORDER — CYCLOBENZAPRINE HCL 10 MG PO TABS: 10.0000 mg | ORAL_TABLET | Freq: Three times a day (TID) | ORAL | Status: AC | PRN

## 2014-12-06 NOTE — Patient Instructions (Signed)
Please await contact with referrals for the psychologist appointment, and referral to a gastroenterologist.   I would like you to take the flexeril for this muscle spasm.  It is best to take at night.  Please do the stretches as instructed.

## 2014-12-08 ENCOUNTER — Encounter: Payer: Self-pay | Admitting: Physician Assistant

## 2014-12-08 NOTE — Progress Notes (Signed)
[facility] [address] [place] [address] [phone]  Date:  11/28/2014   Name:  Patricia Whitehead   DOB:  [birth year]   MRN:  [id number]  PCP:  No PCP Per Patient    History of Present Illness:  Patricia Whitehead is a 29 y.o. female patient who presents [facility] for chief complaint of cough for 4 days.  It is non productive.  She has mild sorethroat secondary to cough.  This was worse before, but has improved.  It includes nasal congestion.  She has no fever or chills.  She is also here for medication refill of the lexapro.  She reports no side effects.  She has no suicidal or homicidal ideations.  She feels as if she didn't have this cough, she would feel much better and be able to sleep, which is effecting her mood.  IBS is better controlled.  Continues to take the linzess.  Reports no added abdominal pain and feels as if it is doing much better.  She has been managing pain with food avoidance.  Major restriction recently included beans, which she would eat daily.  She states that this restriction has helped tremendously.      Patient Active Problem List   Diagnosis Date Noted  . NAUSEA 10/23/2008  . DYSPEPSIA 09/12/2008  . ABDOMINAL PAIN RIGHT UPPER QUADRANT 07/20/2007    Past Medical History  Diagnosis Date  . IBS (irritable bowel syndrome)   . Allergy   . Anemia   . Anxiety   . Depression     History reviewed. No pertinent past surgical history.  Social History  Substance Use Topics  . Smoking status: Never Smoker   . Smokeless tobacco: None  . Alcohol Use: No    Family History  Problem Relation Age of Onset  . Cancer Mother   . Diabetes Mother     Allergies  Allergen Reactions  . Other Swelling    Muscle relaxer's   . Ketamine Other (See Comments)    seizures  . Latex Itching and Rash    Medication list has been reviewed and updated.  Current Outpatient Prescriptions on File Prior to Visit  Medication Sig Dispense Refill  . cetirizine  (ZYRTEC) 10 MG tablet Take 1 tablet (10 mg total) by mouth daily. 30 tablet 11  . clobetasol cream (TEMOVATE) 0.05 % Apply 1 application topically 2 (two) times daily. Do not use on face or genitals (Patient not taking: Reported on 12/06/2014) 30 g 0  . cyclobenzaprine (FLEXERIL) 10 MG tablet Take 1 tablet (10 mg total) by mouth 3 (three) times daily as needed for muscle spasms. 30 tablet 0  . diclofenac sodium (VOLTAREN) 1 % GEL Apply 2 g topically 4 (four) times daily. 100 g 1  . levonorgestrel-ethinyl estradiol (AUBRA) 0.1-20 MG-MCG tablet Take 1 tablet by mouth at bedtime.    Marland Kitchen omeprazole (PRILOSEC) 20 MG capsule Take 1 capsule (20 mg total) by mouth daily. 90 capsule 1  . ondansetron (ZOFRAN) 4 MG tablet Take 1 tablet (4 mg total) by mouth every 6 (six) hours. 12 tablet 0  . ranitidine (ZANTAC) 150 MG tablet Take 1 tablet (150 mg total) by mouth 2 (two) times daily. 60 tablet 5  . FLUoxetine (PROZAC) 10 MG capsule Take 1 capsule (10 mg total) by mouth daily. 1 PO QD FOR 1 WEEK, THEN 2 PO QD 100 capsule 0   No current facility-administered medications on file prior to visit.  ROS ROS otherwise unremarkable unless listed above.   Physical Examination: BP 118/78 mmHg  Pulse 86  Temp(Src) 98.8 F (37.1 C) (Oral)  Resp 18  Ht 5\' 1"  (1.549 m)  Wt 120 lb (54.432 kg)  BMI 22.69 kg/m2  SpO2 97% Ideal Body Weight: Weight in (lb) to have BMI = 25: 132  Physical Exam   Assessment and Plan: 29 year old female is here today for chief complaint of cough and sore throat.    1. Cough URI, viral in appearance.  Treat supportively.  Advised to rtc if she does not have improvement.  - chlorpheniramine-HYDROcodone (TUSSIONEX PENNKINETIC ER) 10-8 MG/5ML SUER; Take 5 mLs by mouth at bedtime as needed for cough.  Dispense: 70 mL; Refill: 0 - benzonatate (TESSALON) 100 MG capsule; Take 1-2 capsules (100-200 mg total) by mouth 3 (three) times daily as needed for cough.  Dispense: 40 capsule; Refill:  0  2. Generalized abdominal pain - Linaclotide (LINZESS) 290 MCG CAPS capsule; Take 1 capsule (290 mcg total) by mouth daily.  Dispense: 30 capsule; Refill: 2  3. IBS (irritable bowel syndrome) Well-controlled.  Continue the linzess.  Advised to drop her next appointment next week, and we can revisit in 6 months.  Included Dr. Laymond Purser, psychologist, to contact for counsel, as this should help manifestations of anxiety/mood disorder.   - Linaclotide (LINZESS) 290 MCG CAPS capsule; Take 1 capsule (290 mcg total) by mouth daily.  Dispense: 30 capsule; Refill: 2 - escitalopram (LEXAPRO) 10 MG tablet; TAKE 1 TABLET (10 MG TOTAL) BY MOUTH DAILY (Patient not taking: Reported on 12/06/2014)  Dispense: 60 tablet; Refill: 3   Trena Platt, PA-C [facility] 12/08/2014 9:26 AM

## 2014-12-08 NOTE — Progress Notes (Signed)
[facility] [address] [place] [address] [phone]  Date:  12/06/2014   Name:  Patricia Whitehead   DOB:  14-Aug-1985   MRN:  [id number]  PCP:  No PCP Per Patient    History of Present Illness:  Patricia Whitehead is a 29 y.o. female patient who presents to [facility] for chief complaint for worsening constipation and blood in stool.  She continues to have the abdominal pain.  She reports that she is taking the linzess daily.  She also reports using a saline enema to help with her bowel movements.  She reports this has been worsening over a month.  She has been to [facility] where her GI sxs were reviewed and she reported differently.  She was seen last week for respiratory sxs, where these sxs were not reported.  She has no dizziness.  I have asked if she used miralax, but states that in the past, she would develop nausea with the linzess.  She feels bloated as if "cannot empty".  Her abdominal pain does is not associated with eating.  She is concerned of a hemorrhoid.  She reports internal hemorrhids in the past, seen on a colonoscopy.  She also has concerns for sleeping.  She is having night terrors, which cause her to stay awake every night. Patient also reports midback pain.  This started a few days ago, as she was moving an object.  The next day she noticed the pain when she moves, rotates waist, and when she sleeps.  She can not sleep on her right side at this time due to the pain.  No frequency, polyuria, or hematuria.     She finalizes with concern for missing work and needs her FMLA forms updated to have 14 days off per month, due to her illness.      Patient Active Problem List   Diagnosis Date Noted  . NAUSEA 10/23/2008  . DYSPEPSIA 09/12/2008  . ABDOMINAL PAIN RIGHT UPPER QUADRANT 07/20/2007    Past Medical History  Diagnosis Date  . IBS (irritable bowel syndrome)   . Allergy   . Anemia   . Anxiety   . Depression     No past surgical history on file.  Social  History  Substance Use Topics  . Smoking status: Never Smoker   . Smokeless tobacco: None  . Alcohol Use: No    Family History  Problem Relation Age of Onset  . Cancer Mother   . Diabetes Mother     Allergies  Allergen Reactions  . Other Swelling    Muscle relaxer's   . Ketamine Other (See Comments)    seizures  . Latex Itching and Rash    Medication list has been reviewed and updated.  Current Outpatient Prescriptions on File Prior to Visit  Medication Sig Dispense Refill  . benzonatate (TESSALON) 100 MG capsule Take 1-2 capsules (100-200 mg total) by mouth 3 (three) times daily as needed for cough. 40 capsule 0  . cetirizine (ZYRTEC) 10 MG tablet Take 1 tablet (10 mg total) by mouth daily. 30 tablet 11  . chlorpheniramine-HYDROcodone (TUSSIONEX PENNKINETIC ER) 10-8 MG/5ML SUER Take 5 mLs by mouth at bedtime as needed for cough. 70 mL 0  . cyclobenzaprine (FLEXERIL) 10 MG tablet Take 1 tablet (10 mg total) by mouth 3 (three) times daily as needed for muscle spasms. 30 tablet 0  . diclofenac sodium (VOLTAREN) 1 % GEL Apply 2 g topically 4 (four) times daily. 100 g  1  . FLUoxetine (PROZAC) 10 MG capsule Take 1 capsule (10 mg total) by mouth daily. 1 PO QD FOR 1 WEEK, THEN 2 PO QD 100 capsule 0  . levonorgestrel-ethinyl estradiol (AUBRA) 0.1-20 MG-MCG tablet Take 1 tablet by mouth at bedtime.    . Linaclotide (LINZESS) 290 MCG CAPS capsule Take 1 capsule (290 mcg total) by mouth daily. 30 capsule 2  . omeprazole (PRILOSEC) 20 MG capsule Take 1 capsule (20 mg total) by mouth daily. 90 capsule 1  . ondansetron (ZOFRAN) 4 MG tablet Take 1 tablet (4 mg total) by mouth every 6 (six) hours. 12 tablet 0  . ranitidine (ZANTAC) 150 MG tablet Take 1 tablet (150 mg total) by mouth 2 (two) times daily. 60 tablet 5  . clobetasol cream (TEMOVATE) 0.05 % Apply 1 application topically 2 (two) times daily. Do not use on face or genitals (Patient not taking: Reported on 12/06/2014) 30 g 0  .  escitalopram (LEXAPRO) 10 MG tablet TAKE 1 TABLET (10 MG TOTAL) BY MOUTH DAILY (Patient not taking: Reported on 12/06/2014) 60 tablet 3   No current facility-administered medications on file prior to visit.    ROS ROS otehrwise unremarkable unless listed above.    Physical Examination: BP 116/72 mmHg  Pulse 76  Temp(Src) 98.8 F (37.1 C) (Oral)  Resp 16  Ht 5' 0.5" (1.537 m)  Wt 122 lb 3.2 oz (55.43 kg)  BMI 23.46 kg/m2  SpO2 100%  LMP 11/03/2014 Ideal Body Weight: Weight in (lb) to have BMI = 25: 129.9 Wt Readings from Last 3 Encounters:  12/06/14 122 lb 3.2 oz (55.43 kg)  11/28/14 120 lb (54.432 kg)  11/06/14 121 lb (54.885 kg)    Physical Exam  Constitutional: She appears well-developed and well-nourished. No distress.  HENT:  Head: Normocephalic and atraumatic.  Eyes: EOM are normal. Pupils are equal, round, and reactive to light. Right eye exhibits no discharge. Left eye exhibits no discharge.  Cardiovascular: Normal rate, regular rhythm, normal heart sounds and intact distal pulses.  Exam reveals no friction rub.   No murmur heard. Pulmonary/Chest: Effort normal and breath sounds normal. No respiratory distress. She has no wheezes.  Abdominal: Soft. Bowel sounds are normal. She exhibits no mass. There is no hepatosplenomegaly. There is generalized tenderness (RUQ). There is no CVA tenderness. No hernia.  Musculoskeletal:       Lumbar back: She exhibits normal range of motion, no bony tenderness and no swelling.       Arms: No spinous tenderness.  Skin: Skin is warm and dry. She is not diaphoretic.  Psychiatric: She has a normal mood and affect. Her behavior is normal.   Results for orders placed or performed in visit on 12/06/14  COMPLETE METABOLIC PANEL WITH GFR  Result Value Ref Range   Sodium 139 135 - 146 mmol/L   Potassium 4.3 3.5 - 5.3 mmol/L   Chloride 107 98 - 110 mmol/L   CO2 26 20 - 31 mmol/L   Glucose, Bld 90 65 - 99 mg/dL   BUN 11 7 - 25 mg/dL    Creat 1.61 0.96 - 0.45 mg/dL   Total Bilirubin 0.2 0.2 - 1.2 mg/dL   Alkaline Phosphatase 61 33 - 115 U/L   AST 13 10 - 30 U/L   ALT 11 6 - 29 U/L   Total Protein 6.8 6.1 - 8.1 g/dL   Albumin 3.7 3.6 - 5.1 g/dL   Calcium 8.9 8.6 - 40.9 mg/dL   GFR, Est  African American >89 >=60 mL/min   GFR, Est Non African American >89 >=60 mL/min  CBC  Result Value Ref Range   WBC 6.7 4.0 - 10.5 K/uL   RBC 3.86 (L) 3.87 - 5.11 MIL/uL   Hemoglobin 12.0 12.0 - 15.0 g/dL   HCT 53.6 (L) 64.4 - 03.4 %   MCV 93.0 78.0 - 100.0 fL   MCH 31.1 26.0 - 34.0 pg   MCHC 33.4 30.0 - 36.0 g/dL   RDW 74.2 59.5 - 63.8 %   Platelets 310 150 - 400 K/uL   MPV 10.1 8.6 - 12.4 fL  POCT urinalysis dipstick  Result Value Ref Range   Color, UA amber    Clarity, UA Slightly Cloudy    Glucose, UA Negative    Bilirubin, UA Negative    Ketones, UA Negative    Spec Grav, UA 1.015    Blood, UA Negative    pH, UA 8.5    Protein, UA negative    Urobilinogen, UA 1.0    Nitrite, UA Negative    Leukocytes, UA Trace (A) Negative  POCT UA - Microscopic Only  Result Value Ref Range   WBC, Ur, HPF, POC 1-3    RBC, urine, microscopic Negative    Bacteria, U Microscopic Trace    Mucus, UA Negative    Epithelial cells, urine per micros 0-5    Crystals, Ur, HPF, POC Negative    Casts, Ur, LPF, POC Negative    Yeast, UA Negative       Assessment and Plan: 29 year old female is here today for chief complaint of abdominal pain, back pain, and constipation.   1. IBS (irritable bowel syndrome) -Patient has declined GI and psych referrals in the past.  I have included psych contact and advised her to seek counsel, on multiple discharge summaries, which have not been pursued.   -Today, referral for psychiatry.   -Gastroenterology consult also appreciated at this time to reassess this dx of IBS -Adjustment letter given for number of days absent per her FMLA.  This 10 day allowance of absence can last for a month before  reassessment.  She will need to complete evaluation and counseling with psychology, along with GI consult at this time.  -Discussed that if she does not pursue specialists, FMLA may be compromised.         - Ambulatory referral to Psychiatry  2. Anxiety Continue the Lexapro  3. Insomnia -Psych counseling is most appropriate at this time, which will improve insomnia.  Reluctant to start a sleep aid to mask need for counseling.  The flexeril will aid provide a sedative quality for sleep at this time.  I have advised that she use this for her muscle spasm at night.    4. RUQ abdominal pain -Possible gallbladder disease, IBS, liver disease, gastritis.  GI consult appreciated at this time.   - COMPLETE METABOLIC PANEL WITH GFR - POCT urinalysis dipstick - POCT UA - Microscopic Only - CBC - Ambulatory referral to Gastroenterology - Ambulatory referral to Psychiatry  5. Constipation, unspecified constipation type - COMPLETE METABOLIC PANEL WITH GFR - POCT urinalysis dipstick - POCT UA - Microscopic Only - CBC - Ambulatory referral to Gastroenterology - Ambulatory referral to Psychiatry  7. Spasm -Flexeril, ice, and stretches discussed with patient. - cyclobenzaprine (FLEXERIL) 10 MG tablet; Take 1 tablet (10 mg total) by mouth 3 (three) times daily as needed for muscle spasms.  Dispense: 30 tablet; Refill: 0  Trena Platt, PA-C [facility] 12/08/2014 7:54 AM

## 2014-12-20 ENCOUNTER — Encounter: Payer: Self-pay | Admitting: Physician Assistant

## 2015-01-10 ENCOUNTER — Encounter: Payer: Self-pay | Admitting: Family Medicine

## 2015-01-15 ENCOUNTER — Ambulatory Visit (INDEPENDENT_AMBULATORY_CARE_PROVIDER_SITE_OTHER): Payer: BLUE CROSS/BLUE SHIELD | Admitting: Psychology

## 2015-01-17 ENCOUNTER — Ambulatory Visit: Payer: BLUE CROSS/BLUE SHIELD | Admitting: Physician Assistant

## 2015-01-31 ENCOUNTER — Ambulatory Visit (INDEPENDENT_AMBULATORY_CARE_PROVIDER_SITE_OTHER): Payer: BLUE CROSS/BLUE SHIELD | Admitting: Psychology

## 2015-01-31 DIAGNOSIS — F4323 Adjustment disorder with mixed anxiety and depressed mood: Secondary | ICD-10-CM | POA: Diagnosis not present

## 2015-02-01 ENCOUNTER — Telehealth: Payer: Self-pay

## 2015-02-01 DIAGNOSIS — K589 Irritable bowel syndrome without diarrhea: Secondary | ICD-10-CM

## 2015-02-01 DIAGNOSIS — K219 Gastro-esophageal reflux disease without esophagitis: Secondary | ICD-10-CM

## 2015-02-01 DIAGNOSIS — F411 Generalized anxiety disorder: Secondary | ICD-10-CM

## 2015-02-01 NOTE — Telephone Encounter (Signed)
Moving to [place] - requesting 90 day supply on escitalopram (LEXAPRO) 10 MG tablet   Wants 20 mg ranitidine (ZANTAC) 150 MG tablet   [facility] [address]  [phone]

## 2015-02-04 MED ORDER — ESCITALOPRAM OXALATE 10 MG PO TABS: ORAL_TABLET | ORAL | Status: DC

## 2015-02-04 MED ORDER — RANITIDINE HCL 150 MG PO TABS: 150.0000 mg | ORAL_TABLET | Freq: Two times a day (BID) | ORAL | Status: DC

## 2015-02-04 NOTE — Telephone Encounter (Signed)
Rxs sent

## 2015-02-06 NOTE — Addendum Note (Signed)
Addended by: Sheppard PlumberBRIGGS, Calvin Chura A on: 02/06/2015 01:57 PM   Modules accepted: Orders

## 2015-02-06 NOTE — Telephone Encounter (Signed)
Pt called back and stated that the lexapro was increased by Judeth CornfieldStephanie a few OVs ago. She has been taking two of the 10 mg tabs for some time and has to go back and get RFs every 2 wks. Judeth CornfieldStephanie, I couldn't find notes going back several OVs where you stated to inc to 20 mg, but see in plan at 8/3 OV, you gave her #60, but sig still says 1 QD. I will pend Rx for 20 mg for your review. Pt is moving to [place] next week and would like 3 month RF to give her time to est w/new provider down there.

## 2015-02-07 ENCOUNTER — Other Ambulatory Visit: Payer: Self-pay | Admitting: Physician Assistant

## 2015-02-07 DIAGNOSIS — K589 Irritable bowel syndrome without diarrhea: Secondary | ICD-10-CM

## 2015-02-07 DIAGNOSIS — F419 Anxiety disorder, unspecified: Secondary | ICD-10-CM

## 2015-02-07 MED ORDER — ESCITALOPRAM OXALATE 20 MG PO TABS: 20.0000 mg | ORAL_TABLET | Freq: Every day | ORAL | Status: DC

## 2015-02-07 NOTE — Progress Notes (Signed)
Please alert patient that I am refilling the medication.  For 3 months supply of the lexapro 20mg .  Ranitidine appears to have been filled.  Please let me know if she has any questions.

## 2015-02-08 ENCOUNTER — Telehealth: Payer: Self-pay

## 2015-02-08 DIAGNOSIS — R1084 Generalized abdominal pain: Secondary | ICD-10-CM

## 2015-02-08 DIAGNOSIS — K589 Irritable bowel syndrome without diarrhea: Secondary | ICD-10-CM

## 2015-02-08 MED ORDER — LINACLOTIDE 290 MCG PO CAPS: 290.0000 ug | ORAL_CAPSULE | Freq: Every day | ORAL | Status: DC

## 2015-02-08 NOTE — Telephone Encounter (Signed)
90day supply sent.

## 2015-02-08 NOTE — Telephone Encounter (Signed)
Patient is calling to request a 3 month supply of linzess sent to [facility] on [address].

## 2015-02-08 NOTE — Progress Notes (Signed)
Left message on machine advising message from provider.

## 2015-04-25 ENCOUNTER — Encounter: Payer: Self-pay | Admitting: Family Medicine

## 2015-04-25 ENCOUNTER — Other Ambulatory Visit: Payer: Self-pay

## 2015-04-25 ENCOUNTER — Ambulatory Visit (INDEPENDENT_AMBULATORY_CARE_PROVIDER_SITE_OTHER): Payer: Self-pay | Admitting: Family Medicine

## 2015-04-25 VITALS — BP 132/77 | HR 66 | Temp 98.8°F | Resp 20

## 2015-04-25 DIAGNOSIS — R1110 Vomiting, unspecified: Secondary | ICD-10-CM

## 2015-04-25 DIAGNOSIS — R1013 Epigastric pain: Secondary | ICD-10-CM

## 2015-04-25 DIAGNOSIS — R1011 Right upper quadrant pain: Secondary | ICD-10-CM

## 2015-04-25 LAB — COMPREHENSIVE METABOLIC PANEL
ALT: 21 U/L (ref 6–29)
AST: 22 U/L (ref 10–30)
Albumin: 5 g/dL (ref 3.6–5.1)
Alkaline Phosphatase: 70 U/L (ref 33–115)
BUN: 25 mg/dL (ref 7–25)
CO2: 20 mmol/L (ref 20–31)
Calcium: 10.2 mg/dL (ref 8.6–10.2)
Chloride: 104 mmol/L (ref 98–110)
Creat: 0.73 mg/dL (ref 0.50–1.10)
Glucose, Bld: 107 mg/dL — ABNORMAL HIGH (ref 65–99)
Potassium: 4.2 mmol/L (ref 3.5–5.3)
Sodium: 140 mmol/L (ref 135–146)
Total Bilirubin: 0.6 mg/dL (ref 0.2–1.2)
Total Protein: 8.2 g/dL — ABNORMAL HIGH (ref 6.1–8.1)

## 2015-04-25 LAB — POCT URINALYSIS DIP (MANUAL ENTRY)
Bilirubin, UA: NEGATIVE
Glucose, UA: NEGATIVE
Leukocytes, UA: NEGATIVE
Nitrite, UA: NEGATIVE
Protein Ur, POC: 30 — AB
Spec Grav, UA: 1.025
Urobilinogen, UA: 0.2
pH, UA: 7

## 2015-04-25 LAB — POC MICROSCOPIC URINALYSIS (UMFC)

## 2015-04-25 LAB — GLUCOSE, POCT (MANUAL RESULT ENTRY): POC Glucose: 103 mg/dl — AB (ref 70–99)

## 2015-04-25 LAB — POCT URINE PREGNANCY: Preg Test, Ur: NEGATIVE

## 2015-04-25 LAB — POCT CBC
Granulocyte percent: 81 %G — AB (ref 37–80)
HCT, POC: 40.5 % (ref 37.7–47.9)
Hemoglobin: 13.9 g/dL (ref 12.2–16.2)
Lymph, poc: 2.2 (ref 0.6–3.4)
MCH, POC: 31.9 pg — AB (ref 27–31.2)
MCHC: 34.4 g/dL (ref 31.8–35.4)
MCV: 92.8 fL (ref 80–97)
MID (cbc): 0.3 (ref 0–0.9)
MPV: 7.9 fL (ref 0–99.8)
POC Granulocyte: 10.9 — AB (ref 2–6.9)
POC LYMPH PERCENT: 16.4 %L (ref 10–50)
POC MID %: 2.6 %M (ref 0–12)
Platelet Count, POC: 234 10*3/uL (ref 142–424)
RBC: 4.36 M/uL (ref 4.04–5.48)
RDW, POC: 13.1 %
WBC: 13.4 10*3/uL — AB (ref 4.6–10.2)

## 2015-04-25 LAB — LIPASE: Lipase: 20 U/L (ref 7–60)

## 2015-04-25 MED ORDER — CIPROFLOXACIN HCL 500 MG PO TABS: 500.0000 mg | ORAL_TABLET | Freq: Two times a day (BID) | ORAL | Status: DC

## 2015-04-25 MED ORDER — ONDANSETRON HCL 4 MG/2ML IJ SOLN
4.0000 mg | Freq: Once | INTRAMUSCULAR | Status: AC
  Administered 2015-04-25: 4 mg via INTRAVENOUS

## 2015-04-25 MED ORDER — ONDANSETRON HCL 4 MG/2ML IJ SOLN
2.0000 mg | Freq: Once | INTRAMUSCULAR | Status: DC
Start: 2015-04-25 — End: 2015-04-25

## 2015-04-25 MED ORDER — ONDANSETRON HCL 8 MG PO TABS: 8.0000 mg | ORAL_TABLET | Freq: Three times a day (TID) | ORAL | Status: DC | PRN

## 2015-04-25 MED ORDER — GI COCKTAIL ~~LOC~~
30.0000 mL | Freq: Once | ORAL | Status: AC
  Administered 2015-04-25: 30 mL via ORAL

## 2015-04-25 NOTE — Progress Notes (Signed)
[facility] [address] [place] [address] [phone]  Date:  04/25/2015   Name:  Patricia Whitehead   DOB:  April 01, 1986   MRN:  [id number]  PCP:  No PCP Per Patient    Chief Complaint: No chief complaint on file.   History of Present Illness:  Patricia Whitehead is a 29 y.o. very pleasant female patient who presents with the following:  Young woman with history of IBS here today with acute onset of epigastric pain and intractable vomiting that started this am around 0700 and has been constant since.  When she first woke up she had to have a BM- while still on the toilet she started vomiting.  Has not had diarrhea really.  Her mother brought her in today as she could not drive LMP 3 weeks ago She has not taken any medication at home today  She is otherwise generally healthy   Patient Active Problem List   Diagnosis Date Noted  . NAUSEA 10/23/2008  . DYSPEPSIA 09/12/2008  . ABDOMINAL PAIN RIGHT UPPER QUADRANT 07/20/2007    Past Medical History  Diagnosis Date  . IBS (irritable bowel syndrome)   . Allergy   . Anemia   . Anxiety   . Depression     No past surgical history on file.  Social History  Substance Use Topics  . Smoking status: Never Smoker   . Smokeless tobacco: Not on file  . Alcohol Use: No    Family History  Problem Relation Age of Onset  . Cancer Mother   . Diabetes Mother     Allergies  Allergen Reactions  . Other Swelling    Muscle relaxer's   . Ketamine Other (See Comments)    seizures  . Latex Itching and Rash    Medication list has been reviewed and updated.  Current Outpatient Prescriptions on File Prior to Visit  Medication Sig Dispense Refill  . benzonatate (TESSALON) 100 MG capsule Take 1-2 capsules (100-200 mg total) by mouth 3 (three) times daily as needed for cough. 40 capsule 0  . cetirizine (ZYRTEC) 10 MG tablet Take 1 tablet (10 mg total) by mouth daily. 30 tablet 11  . clobetasol cream (TEMOVATE) 0.05 %  Apply 1 application topically 2 (two) times daily. Do not use on face or genitals (Patient not taking: Reported on 12/06/2014) 30 g 0  . cyclobenzaprine (FLEXERIL) 10 MG tablet Take 1 tablet (10 mg total) by mouth 3 (three) times daily as needed for muscle spasms. 30 tablet 0  . diclofenac sodium (VOLTAREN) 1 % GEL Apply 2 g topically 4 (four) times daily. 100 g 1  . escitalopram (LEXAPRO) 20 MG tablet Take 1 tablet (20 mg total) by mouth daily. 90 tablet 0  . FLUoxetine (PROZAC) 10 MG capsule Take 1 capsule (10 mg total) by mouth daily. 1 PO QD FOR 1 WEEK, THEN 2 PO QD 100 capsule 0  . levonorgestrel-ethinyl estradiol (AUBRA) 0.1-20 MG-MCG tablet Take 1 tablet by mouth at bedtime.    . Linaclotide (LINZESS) 290 MCG CAPS capsule Take 1 capsule (290 mcg total) by mouth daily. 90 capsule 0  . omeprazole (PRILOSEC) 20 MG capsule Take 1 capsule (20 mg total) by mouth daily. 90 capsule 1  . ondansetron (ZOFRAN) 4 MG tablet Take 1 tablet (4 mg total) by mouth every 6 (six) hours. 12 tablet 0  . ranitidine (ZANTAC) 150 MG tablet Take 1 tablet (150 mg total) by mouth 2 (two) times daily. 180  tablet 0   No current facility-administered medications on file prior to visit.    Review of Systems:  As per HPI- otherwise negative.   Physical Examination: Filed Vitals:   04/25/15 1001 04/25/15 1212  BP: 132/77   Pulse: 57 66  Temp: 98.8 F (37.1 C)   Resp: 20     GEN: WDWN, Non-toxic, A & O x 3, crying and retching over a basin HEENT: Atraumatic, Normocephalic. Neck supple. No masses, No LAD. Ears and Nose: No external deformity. CV: RRR, No M/G/R. No JVD. No thrill. No extra heart sounds. PULM: CTA B, no wheezes, crackles, rhonchi. No retractions. No resp. distress. No accessory muscle use. ABD: S, ND, +BS. No rebound. No HSM.  Epigastric and RUQ tenderness EXTR: No c/c/e NEURO Normal gait.  PSYCH: Normally interactive. Conversant. Not depressed or anxious appearing.  Calm demeanor.   [facility]  [facility] and gave 4mg  of IV zofran at once.  Felt improved after zofran. Gave a GI cocktail- did not change her sx  Results for orders placed or performed in visit on 04/25/15  Comprehensive metabolic panel  Result Value Ref Range   Sodium 140 135 - 146 mmol/L   Potassium 4.2 3.5 - 5.3 mmol/L   Chloride 104 98 - 110 mmol/L   CO2 20 20 - 31 mmol/L   Glucose, Bld 107 (H) 65 - 99 mg/dL   BUN 25 7 - 25 mg/dL   Creat 1.610.73 0.960.50 - 0.451.10 mg/dL   Total Bilirubin 0.6 0.2 - 1.2 mg/dL   Alkaline Phosphatase 70 33 - 115 U/L   AST 22 10 - 30 U/L   ALT 21 6 - 29 U/L   Total Protein 8.2 (H) 6.1 - 8.1 g/dL   Albumin 5.0 3.6 - 5.1 g/dL   Calcium 40.910.2 8.6 - 81.110.2 mg/dL  Lipase  Result Value Ref Range   Lipase 20 7 - 60 U/L  POCT CBC  Result Value Ref Range   WBC 13.4 (A) 4.6 - 10.2 K/uL   Lymph, poc 2.2 0.6 - 3.4   POC LYMPH PERCENT 16.4 10 - 50 %L   MID (cbc) 0.3 0 - 0.9   POC MID % 2.6 0 - 12 %M   POC Granulocyte 10.9 (A) 2 - 6.9   Granulocyte percent 81.0 (A) 37 - 80 %G   RBC 4.36 4.04 - 5.48 M/uL   Hemoglobin 13.9 12.2 - 16.2 g/dL   HCT, POC 91.440.5 78.237.7 - 47.9 %   MCV 92.8 80 - 97 fL   MCH, POC 31.9 (A) 27 - 31.2 pg   MCHC 34.4 31.8 - 35.4 g/dL   RDW, POC 95.613.1 %   Platelet Count, POC 234 142 - 424 K/uL   MPV 7.9 0 - 99.8 fL  POCT glucose (manual entry)  Result Value Ref Range   POC Glucose 103 (A) 70 - 99 mg/dl  POCT urinalysis dipstick  Result Value Ref Range   Color, UA yellow yellow   Clarity, UA clear clear   Glucose, UA negative negative   Bilirubin, UA negative negative   Ketones, POC UA small (15) (A) negative   Spec Grav, UA 1.025    Blood, UA moderate (A) negative   pH, UA 7.0    Protein Ur, POC =30 (A) negative   Urobilinogen, UA 0.2    Nitrite, UA Negative Negative   Leukocytes, UA Negative Negative  POCT Microscopic Urinalysis ([facility])  Result Value Ref Range  WBC,UR,HPF,POC None None WBC/hpf   RBC,UR,HPF,POC Moderate (A) None RBC/hpf   Bacteria Few  (A) None, Too numerous to count   Mucus Present (A) Absent   Epithelial Cells, UR Per Microscopy Few (A) None, Too numerous to count cells/hpf  POCT urine pregnancy  Result Value Ref Range   Preg Test, Ur Negative Negative   Given another  of IV zofran at 11 am as her nausea was coming back Hydrated with 2 bags of IV saline and she felt a good bit better Continued to have RUQ and epigastric tenderness  Assessment and Plan: Intractable vomiting with nausea, vomiting of unspecified type - Plan: ondansetron (ZOFRAN) injection 4 mg, POCT urinalysis dipstick, POCT Microscopic Urinalysis ([facility]), POCT urine pregnancy, Comprehensive metabolic panel, Lipase, Urine culture, ondansetron (ZOFRAN) injection 4 mg, DISCONTINUED: ondansetron (ZOFRAN) injection 2 mg  Abdominal pain, epigastric - Plan: POCT CBC, POCT glucose (manual entry), gi cocktail (Maalox,Lidocaine,Donnatal), US Abdomen Complete  RUQ pain - Plan: US Abdomen Complete  Seen here today with sudden onset of epigastric pain and vomiting.  Treated with IV saline 2L and  of IV zofran x2- she felt much better.  Sent for an [place] today to rule- out cholecystitis or obstructing kidney stone.  Assuming that her [place] is ok plan to treat her with zofran and cipro for possible UTI while urine culture is pending  Meds ordered this encounter  Medications  . DISCONTD: ondansetron (ZOFRAN) injection 2 mg    Sig:   . gi cocktail (Maalox,Lidocaine,Donnatal)    Sig:   . ondansetron (ZOFRAN) injection 4 mg    Sig:   . ondansetron (ZOFRAN) injection 4 mg    Sig:   . ondansetron (ZOFRAN) 8 MG tablet    Sig: Take 1 tablet (8 mg total) by mouth every 8 (eight) hours as needed for nausea or vomiting.    Dispense:  15 tablet    Refill:  0  . ciprofloxacin (CIPRO) 500 MG tablet    Sig: Take 1 tablet (500 mg total) by mouth 2 (two) times daily.    Dispense:  14 tablet    Refill:  0   Signed Abbe Amsterdam, MD  She was released to home at noon and we  planned for her [place] at 2:30. However she did not go to the ultrasound.  Have called her at 4:30 and again at 7:50 and no answer- LMOM. Called again at 8:30 and she did not answer.  The rest of her labs are reassuring.  Her emergency contact number is her sister- however did not call her as Patricia Whitehead reported that her sister actually just gave birth today.

## 2015-04-25 NOTE — Patient Instructions (Signed)
Please go for your ultrasound - [facility] [address]  2:30 pm- do not eat or drink anything.  I will look for your other labs in the meantime.  If you are NOT ok please just go to the ER and they

## 2015-04-26 LAB — URINE CULTURE: Colony Count: 40000

## 2015-04-27 ENCOUNTER — Emergency Department (HOSPITAL_COMMUNITY)
Admission: EM | Admit: 2015-04-27 | Discharge: 2015-04-27 | Disposition: A | Discharge status: Home / Self Care | Payer: BLUE CROSS/BLUE SHIELD | Attending: Emergency Medicine | Admitting: Emergency Medicine

## 2015-04-27 ENCOUNTER — Encounter (HOSPITAL_COMMUNITY): Payer: Self-pay | Admitting: Oncology

## 2015-04-27 DIAGNOSIS — Z8719 Personal history of other diseases of the digestive system: Secondary | ICD-10-CM | POA: Insufficient documentation

## 2015-04-27 DIAGNOSIS — F1210 Cannabis abuse, uncomplicated: Secondary | ICD-10-CM | POA: Insufficient documentation

## 2015-04-27 DIAGNOSIS — Z8659 Personal history of other mental and behavioral disorders: Secondary | ICD-10-CM | POA: Insufficient documentation

## 2015-04-27 DIAGNOSIS — Z862 Personal history of diseases of the blood and blood-forming organs and certain disorders involving the immune mechanism: Secondary | ICD-10-CM | POA: Insufficient documentation

## 2015-04-27 DIAGNOSIS — Z91040 Latex allergy status: Secondary | ICD-10-CM | POA: Insufficient documentation

## 2015-04-27 DIAGNOSIS — Z3202 Encounter for pregnancy test, result negative: Secondary | ICD-10-CM | POA: Insufficient documentation

## 2015-04-27 DIAGNOSIS — G43A0 Cyclical vomiting, not intractable: Secondary | ICD-10-CM | POA: Insufficient documentation

## 2015-04-27 DIAGNOSIS — R109 Unspecified abdominal pain: Secondary | ICD-10-CM | POA: Insufficient documentation

## 2015-04-27 DIAGNOSIS — Z792 Long term (current) use of antibiotics: Secondary | ICD-10-CM | POA: Insufficient documentation

## 2015-04-27 DIAGNOSIS — R1115 Cyclical vomiting syndrome unrelated to migraine: Secondary | ICD-10-CM

## 2015-04-27 LAB — URINALYSIS, ROUTINE W REFLEX MICROSCOPIC
Bilirubin Urine: NEGATIVE
Glucose, UA: NEGATIVE mg/dL
Ketones, ur: 40 mg/dL — AB
Leukocytes, UA: NEGATIVE
Nitrite: NEGATIVE
Protein, ur: NEGATIVE mg/dL
Specific Gravity, Urine: 1.009 (ref 1.005–1.030)
pH: 7 (ref 5.0–8.0)

## 2015-04-27 LAB — COMPREHENSIVE METABOLIC PANEL
ALT: 21 U/L (ref 14–54)
AST: 20 U/L (ref 15–41)
Albumin: 4.7 g/dL (ref 3.5–5.0)
Alkaline Phosphatase: 64 U/L (ref 38–126)
Anion gap: 11 (ref 5–15)
BUN: 14 mg/dL (ref 6–20)
CO2: 25 mmol/L (ref 22–32)
Calcium: 9.5 mg/dL (ref 8.9–10.3)
Chloride: 105 mmol/L (ref 101–111)
Creatinine, Ser: 0.61 mg/dL (ref 0.44–1.00)
GFR calc Af Amer: >60 mL/min (ref >60)
GFR calc non Af Amer: >60 mL/min (ref >60)
Glucose, Bld: 100 mg/dL — ABNORMAL HIGH (ref 65–99)
Potassium: 3.5 mmol/L (ref 3.5–5.1)
Sodium: 141 mmol/L (ref 135–145)
Total Bilirubin: 0.8 mg/dL (ref 0.3–1.2)
Total Protein: 7.8 g/dL (ref 6.5–8.1)

## 2015-04-27 LAB — CBC
HCT: 36.6 % (ref 36.0–46.0)
Hemoglobin: 12 g/dL (ref 12.0–15.0)
MCH: 30.9 pg (ref 26.0–34.0)
MCHC: 32.8 g/dL (ref 30.0–36.0)
MCV: 94.3 fL (ref 78.0–100.0)
Platelets: 223 10*3/uL (ref 150–400)
RBC: 3.88 MIL/uL (ref 3.87–5.11)
RDW: 12.6 % (ref 11.5–15.5)
WBC: 10.5 10*3/uL (ref 4.0–10.5)

## 2015-04-27 LAB — I-STAT BETA HCG BLOOD, ED (MC, WL, AP ONLY): I-stat hCG, quantitative: <5 m[IU]/mL (ref <5)

## 2015-04-27 LAB — URINE MICROSCOPIC-ADD ON: WBC, UA: NONE SEEN WBC/hpf (ref 0–5)

## 2015-04-27 LAB — LIPASE, BLOOD: Lipase: 26 U/L (ref 11–51)

## 2015-04-27 LAB — POC URINE PREG, ED: Preg Test, Ur: NEGATIVE

## 2015-04-27 LAB — RAPID URINE DRUG SCREEN, HOSP PERFORMED
Amphetamines: NOT DETECTED
Barbiturates: NOT DETECTED
Benzodiazepines: NOT DETECTED
Cocaine: NOT DETECTED
Opiates: NOT DETECTED
Tetrahydrocannabinol: POSITIVE — AB

## 2015-04-27 MED ORDER — HYOSCYAMINE SULFATE 0.125 MG SL SUBL: 0.1250 mg | SUBLINGUAL_TABLET | SUBLINGUAL | Status: DC | PRN

## 2015-04-27 MED ORDER — GI COCKTAIL ~~LOC~~
30.0000 mL | Freq: Once | ORAL | Status: AC
  Administered 2015-04-27: 30 mL via ORAL
  Filled 2015-04-27: qty 30

## 2015-04-27 MED ORDER — DICYCLOMINE HCL 10 MG/ML IM SOLN
20.0000 mg | Freq: Once | INTRAMUSCULAR | Status: AC
  Administered 2015-04-27: 20 mg via INTRAMUSCULAR
  Filled 2015-04-27: qty 2

## 2015-04-27 MED ORDER — ONDANSETRON 8 MG PO TBDP: ORAL_TABLET | ORAL | Status: DC

## 2015-04-27 MED ORDER — HALOPERIDOL LACTATE 5 MG/ML IJ SOLN
2.0000 mg | Freq: Once | INTRAMUSCULAR | Status: AC
  Administered 2015-04-27: 2 mg via INTRAVENOUS
  Filled 2015-04-27: qty 1

## 2015-04-27 MED ORDER — KETOROLAC TROMETHAMINE 30 MG/ML IJ SOLN
30.0000 mg | Freq: Once | INTRAMUSCULAR | Status: AC
  Administered 2015-04-27: 30 mg via INTRAVENOUS
  Filled 2015-04-27: qty 1

## 2015-04-27 NOTE — ED Notes (Signed)
Pt presents d/t abdominal pain, nausea and vomiting since Wednesday.  Pt saw her PCP who rx an antibiotic that did not help pt's sx.  PCP referred pt to ED to r/o gallbladder issues.

## 2015-04-27 NOTE — ED Provider Notes (Signed)
CSN: [id number]     Arrival date & time 04/27/15  0041 History   First MD Initiated Contact with Patient 04/27/15 [phone]     Chief Complaint  Patient presents with  . Abdominal Pain     (Consider location/radiation/quality/duration/timing/severity/associated sxs/prior Treatment) Patient is a 29 y.o. female presenting with abdominal pain. The history is provided by the patient.  Abdominal Pain Pain location:  Generalized Pain quality: cramping   Pain radiates to:  Does not radiate Pain severity:  Severe Onset quality:  Sudden Timing:  Constant Progression:  Unchanged Chronicity:  Recurrent Context: not trauma   Relieved by:  Nothing Worsened by:  Nothing tried Ineffective treatments:  None tried Associated symptoms: nausea and vomiting   Associated symptoms: no constipation, no diarrhea and no fever   Risk factors: no alcohol abuse     Past Medical History  Diagnosis Date  . IBS (irritable bowel syndrome)   . Allergy   . Anemia   . Anxiety   . Depression    History reviewed. No pertinent past surgical history. Family History  Problem Relation Age of Onset  . Cancer Mother   . Diabetes Mother    Social History  Substance Use Topics  . Smoking status: Never Smoker   . Smokeless tobacco: Never Used  . Alcohol Use: No   OB History    No data available     Review of Systems  Constitutional: Negative for fever.  Gastrointestinal: Positive for nausea, vomiting and abdominal pain. Negative for diarrhea and constipation.  All other systems reviewed and are negative.     Allergies  Other; Ketamine; and Latex  Home Medications   Prior to Admission medications   Medication Sig Start Date End Date Taking? Authorizing Provider  ciprofloxacin (CIPRO) 500 MG tablet Take 1 tablet (500 mg total) by mouth 2 (two) times daily. 04/25/15  Yes Gwenlyn Found Copland, MD  ondansetron (ZOFRAN) 8 MG tablet Take 1 tablet (8 mg total) by mouth every 8 (eight) hours as needed for  nausea or vomiting. 04/25/15  Yes Gwenlyn Found Copland, MD  benzonatate (TESSALON) 100 MG capsule Take 1-2 capsules (100-200 mg total) by mouth 3 (three) times daily as needed for cough. Patient not taking: Reported on 04/25/2015 11/28/14   Collie Siad English, PA  cetirizine (ZYRTEC) 10 MG tablet Take 1 tablet (10 mg total) by mouth daily. Patient not taking: Reported on 04/25/2015 07/10/14   Jonita Albee, MD  hyoscyamine (LEVSIN/SL) 0.125 MG SL tablet Place 1 tablet (0.125 mg total) under the tongue every 4 (four) hours as needed. 04/27/15   Benn Tarver, MD  Linaclotide [facility]) 290 MCG CAPS capsule Take 1 capsule (290 mcg total) by mouth daily. Patient not taking: Reported on 04/25/2015 02/08/15   Collie Siad English, PA  ondansetron (ZOFRAN ODT) 8 MG disintegrating tablet  ODT q8 hours prn nausea 04/27/15   Janiyha Montufar, MD  ranitidine (ZANTAC) 150 MG tablet Take 1 tablet (150 mg total) by mouth 2 (two) times daily. Patient not taking: Reported on 04/25/2015 02/04/15   Collie Siad English, PA   BP 142/89 mmHg  Pulse 74  Temp(Src) 98.1 F (36.7 C) (Oral)  Resp 20  SpO2 100% Physical Exam  Constitutional: She is oriented to person, place, and time. She appears well-developed and well-nourished. No distress.  HENT:  Head: Normocephalic and atraumatic.  Mouth/Throat: Oropharynx is clear and moist.  Eyes: Conjunctivae are normal. Pupils are equal, round, and reactive to light.  Neck: Normal range  of motion. Neck supple.  Cardiovascular: Normal rate, regular rhythm and intact distal pulses.   Pulmonary/Chest: Effort normal and breath sounds normal. No respiratory distress. She has no wheezes. She has no rales.  Abdominal: Soft. Bowel sounds are normal. There is no tenderness. There is no rebound and no guarding.  Musculoskeletal: Normal range of motion.  Neurological: She is alert and oriented to person, place, and time.  Skin: Skin is warm and dry.  Psychiatric: She has a normal mood  and affect.    ED Course  Procedures (including critical care time) Labs Review Labs Reviewed  COMPREHENSIVE METABOLIC PANEL - Abnormal; Notable for the following:    Glucose, Bld 100 (*)    All other components within normal limits  URINALYSIS, ROUTINE W REFLEX MICROSCOPIC (NOT AT [facility]) - Abnormal; Notable for the following:    Hgb urine dipstick SMALL (*)    Ketones, ur 40 (*)    All other components within normal limits  URINE RAPID DRUG SCREEN, HOSP PERFORMED - Abnormal; Notable for the following:    Tetrahydrocannabinol POSITIVE (*)    All other components within normal limits  URINE MICROSCOPIC-ADD ON - Abnormal; Notable for the following:    Squamous Epithelial / LPF 0-5 (*)    Bacteria, UA RARE (*)    All other components within normal limits  LIPASE, BLOOD  CBC  POC URINE PREG, ED  I-STAT BETA HCG BLOOD, ED ([facility], [facility], [facility] ONLY)    Imaging Review No results found. I have personally reviewed and evaluated these images and lab results as part of my medical decision-making.   EKG Interpretation None      MDM   Final diagnoses:  Non-intractable cyclical vomiting with nausea   Results for orders placed or performed during the hospital encounter of 04/27/15  Lipase, blood  Result Value Ref Range   Lipase 26 11 - 51 U/L  Comprehensive metabolic panel  Result Value Ref Range   Sodium 141 135 - 145 mmol/L   Potassium 3.5 3.5 - 5.1 mmol/L   Chloride 105 101 - 111 mmol/L   CO2 25 22 - 32 mmol/L   Glucose, Bld 100 (H) 65 - 99 mg/dL   BUN 14 6 - 20 mg/dL   Creatinine, Ser 1.610.61 0.44 - 1.00 mg/dL   Calcium 9.5 8.9 - 09.610.3 mg/dL   Total Protein 7.8 6.5 - 8.1 g/dL   Albumin 4.7 3.5 - 5.0 g/dL   AST 20 15 - 41 U/L   ALT 21 14 - 54 U/L   Alkaline Phosphatase 64 38 - 126 U/L   Total Bilirubin 0.8 0.3 - 1.2 mg/dL   GFR calc non Af Amer >60 >60 mL/min   GFR calc Af Amer >60 >60 mL/min   Anion gap 11 5 - 15  CBC  Result Value Ref Range   WBC 10.5 4.0 - 10.5 K/uL   RBC  3.88 3.87 - 5.11 MIL/uL   Hemoglobin 12.0 12.0 - 15.0 g/dL   HCT 04.536.6 40.936.0 - 81.146.0 %   MCV 94.3 78.0 - 100.0 fL   MCH 30.9 26.0 - 34.0 pg   MCHC 32.8 30.0 - 36.0 g/dL   RDW 91.412.6 78.211.5 - 95.615.5 %   Platelets 223 150 - 400 K/uL  Urinalysis, Routine w reflex microscopic (not at [facility])  Result Value Ref Range   Color, Urine YELLOW YELLOW   APPearance CLEAR CLEAR   Specific Gravity, Urine 1.009 1.005 - 1.030   pH 7.0 5.0 -  8.0   Glucose, UA NEGATIVE NEGATIVE mg/dL   Hgb urine dipstick SMALL (A) NEGATIVE   Bilirubin Urine NEGATIVE NEGATIVE   Ketones, ur 40 (A) NEGATIVE mg/dL   Protein, ur NEGATIVE NEGATIVE mg/dL   Nitrite NEGATIVE NEGATIVE   Leukocytes, UA NEGATIVE NEGATIVE  Urine rapid drug screen (hosp performed)  Result Value Ref Range   Opiates NONE DETECTED NONE DETECTED   Cocaine NONE DETECTED NONE DETECTED   Benzodiazepines NONE DETECTED NONE DETECTED   Amphetamines NONE DETECTED NONE DETECTED   Tetrahydrocannabinol POSITIVE (A) NONE DETECTED   Barbiturates NONE DETECTED NONE DETECTED  Urine microscopic-add on  Result Value Ref Range   Squamous Epithelial / LPF 0-5 (A) NONE SEEN   WBC, UA NONE SEEN 0 - 5 WBC/hpf   RBC / HPF 0-5 0 - 5 RBC/hpf   Bacteria, UA RARE (A) NONE SEEN  POC Urine Pregnancy, ED (do NOT order at [facility])  Result Value Ref Range   Preg Test, Ur NEGATIVE NEGATIVE  I-Stat Beta hCG blood, ED ([facility], [facility], [facility] only)  Result Value Ref Range   I-stat hCG, quantitative <5.0 <5 mIU/mL   Comment 3           No results found.   Cyclic vomiting syndrome.  Exam and vitals are benign and reassuring.  Consistent with chronic marijuana use.  Patient states she has not smoke marijuana in a long time since moving from [place].  She states she moved her just a month ago.  According to [facility] patient has been seen at [facility] on a fairly regular basis since 2013, many visits to [facility] are for vomiting with abdominal pain but there is no UDS in the system since 2010.  I suspect that she is not  being forthcoming about her marijuana usage.  EDP informed patient that smoking marijuana is known to cause abdominal pain and cyclic vomiting and is advised . Patient states she has a UTI and that is the cause but according to culture she has only a few mixed species which are clearly contamination.  I also do not believe the patient has biliary colic.    Medications  dicyclomine (BENTYL) injection 20 mg (20 mg Intramuscular Given 04/27/15 0552)  haloperidol lactate (HALDOL) injection 2 mg (2 mg Intravenous Given 04/27/15 0549)  ketorolac (TORADOL) 30 MG/ML injection 30 mg (30 mg Intravenous Given 04/27/15 0732)  gi cocktail (Maalox,Lidocaine,Donnatal) (30 mLs Oral Given 04/27/15 0731)    Stop marijuana.  Warm shower at home and follow up with your PMD.      Aloria Looper, MD 04/27/15 [phone]

## 2015-04-27 NOTE — ED Notes (Signed)
Pt tearful a this time

## 2015-04-30 ENCOUNTER — Encounter: Payer: Self-pay | Admitting: Family Medicine

## 2015-06-02 ENCOUNTER — Encounter (HOSPITAL_COMMUNITY): Payer: Self-pay | Admitting: *Deleted

## 2015-06-02 ENCOUNTER — Emergency Department (HOSPITAL_COMMUNITY): Payer: BLUE CROSS/BLUE SHIELD

## 2015-06-02 ENCOUNTER — Emergency Department (HOSPITAL_COMMUNITY)
Admission: EM | Admit: 2015-06-02 | Discharge: 2015-06-02 | Disposition: A | Discharge status: Home / Self Care | Payer: Self-pay | Attending: Emergency Medicine | Admitting: Emergency Medicine

## 2015-06-02 DIAGNOSIS — G8929 Other chronic pain: Secondary | ICD-10-CM | POA: Insufficient documentation

## 2015-06-02 DIAGNOSIS — Z91040 Latex allergy status: Secondary | ICD-10-CM | POA: Insufficient documentation

## 2015-06-02 DIAGNOSIS — R1013 Epigastric pain: Secondary | ICD-10-CM | POA: Insufficient documentation

## 2015-06-02 DIAGNOSIS — Z862 Personal history of diseases of the blood and blood-forming organs and certain disorders involving the immune mechanism: Secondary | ICD-10-CM | POA: Insufficient documentation

## 2015-06-02 DIAGNOSIS — Z3202 Encounter for pregnancy test, result negative: Secondary | ICD-10-CM | POA: Insufficient documentation

## 2015-06-02 DIAGNOSIS — R1110 Vomiting, unspecified: Secondary | ICD-10-CM | POA: Insufficient documentation

## 2015-06-02 DIAGNOSIS — Z8659 Personal history of other mental and behavioral disorders: Secondary | ICD-10-CM | POA: Insufficient documentation

## 2015-06-02 DIAGNOSIS — K589 Irritable bowel syndrome without diarrhea: Secondary | ICD-10-CM | POA: Insufficient documentation

## 2015-06-02 LAB — URINALYSIS, ROUTINE W REFLEX MICROSCOPIC
Bilirubin Urine: NEGATIVE
Glucose, UA: NEGATIVE mg/dL
Ketones, ur: >80 mg/dL — AB
Nitrite: NEGATIVE
Protein, ur: 30 mg/dL — AB
Specific Gravity, Urine: 1.026 (ref 1.005–1.030)
pH: 7.5 (ref 5.0–8.0)

## 2015-06-02 LAB — I-STAT BETA HCG BLOOD, ED (MC, WL, AP ONLY): I-stat hCG, quantitative: <5 m[IU]/mL (ref <5)

## 2015-06-02 LAB — LIPASE, BLOOD: Lipase: 27 U/L (ref 11–51)

## 2015-06-02 LAB — COMPREHENSIVE METABOLIC PANEL
ALT: 22 U/L (ref 14–54)
AST: 25 U/L (ref 15–41)
Albumin: 5.2 g/dL — ABNORMAL HIGH (ref 3.5–5.0)
Alkaline Phosphatase: 62 U/L (ref 38–126)
Anion gap: 13 (ref 5–15)
BUN: 17 mg/dL (ref 6–20)
CO2: 20 mmol/L — ABNORMAL LOW (ref 22–32)
Calcium: 9.7 mg/dL (ref 8.9–10.3)
Chloride: 104 mmol/L (ref 101–111)
Creatinine, Ser: 0.72 mg/dL (ref 0.44–1.00)
GFR calc Af Amer: >60 mL/min (ref >60)
GFR calc non Af Amer: >60 mL/min (ref >60)
Glucose, Bld: 113 mg/dL — ABNORMAL HIGH (ref 65–99)
Potassium: 3.4 mmol/L — ABNORMAL LOW (ref 3.5–5.1)
Sodium: 137 mmol/L (ref 135–145)
Total Bilirubin: 0.6 mg/dL (ref 0.3–1.2)
Total Protein: 8.9 g/dL — ABNORMAL HIGH (ref 6.5–8.1)

## 2015-06-02 LAB — CBC
HCT: 36.2 % (ref 36.0–46.0)
Hemoglobin: 12.6 g/dL (ref 12.0–15.0)
MCH: 31.7 pg (ref 26.0–34.0)
MCHC: 34.8 g/dL (ref 30.0–36.0)
MCV: 91 fL (ref 78.0–100.0)
Platelets: 265 10*3/uL (ref 150–400)
RBC: 3.98 MIL/uL (ref 3.87–5.11)
RDW: 12.5 % (ref 11.5–15.5)
WBC: 13.2 10*3/uL — ABNORMAL HIGH (ref 4.0–10.5)

## 2015-06-02 LAB — URINE MICROSCOPIC-ADD ON

## 2015-06-02 MED ORDER — DICYCLOMINE HCL 10 MG/ML IM SOLN
20.0000 mg | Freq: Once | INTRAMUSCULAR | Status: AC
  Administered 2015-06-02: 20 mg via INTRAMUSCULAR
  Filled 2015-06-02: qty 2

## 2015-06-02 MED ORDER — HYDROMORPHONE HCL 1 MG/ML IJ SOLN
0.5000 mg | Freq: Once | INTRAMUSCULAR | Status: AC
  Administered 2015-06-02: 0.5 mg via INTRAVENOUS
  Filled 2015-06-02: qty 1

## 2015-06-02 MED ORDER — ONDANSETRON 4 MG PO TBDP: 4.0000 mg | ORAL_TABLET | Freq: Once | ORAL | Status: DC | PRN

## 2015-06-02 MED ORDER — IOHEXOL 300 MG/ML  SOLN
100.0000 mL | Freq: Once | INTRAMUSCULAR | Status: AC | PRN
  Administered 2015-06-02: 100 mL via INTRAVENOUS

## 2015-06-02 MED ORDER — ONDANSETRON HCL 4 MG/2ML IJ SOLN
4.0000 mg | Freq: Once | INTRAMUSCULAR | Status: AC
  Administered 2015-06-02: 4 mg via INTRAVENOUS
  Filled 2015-06-02: qty 2

## 2015-06-02 MED ORDER — GI COCKTAIL ~~LOC~~
30.0000 mL | Freq: Once | ORAL | Status: AC
  Administered 2015-06-02: 30 mL via ORAL
  Filled 2015-06-02: qty 30

## 2015-06-02 MED ORDER — HALOPERIDOL LACTATE 5 MG/ML IJ SOLN
2.0000 mg | Freq: Once | INTRAMUSCULAR | Status: AC
  Administered 2015-06-02: 2 mg via INTRAVENOUS
  Filled 2015-06-02: qty 1

## 2015-06-02 MED ORDER — KETOROLAC TROMETHAMINE 30 MG/ML IJ SOLN
30.0000 mg | Freq: Once | INTRAMUSCULAR | Status: AC
  Administered 2015-06-02: 30 mg via INTRAVENOUS
  Filled 2015-06-02: qty 1

## 2015-06-02 MED ORDER — IOHEXOL 300 MG/ML  SOLN
25.0000 mL | Freq: Once | INTRAMUSCULAR | Status: AC | PRN
  Administered 2015-06-02: 25 mL via ORAL

## 2015-06-02 MED ORDER — ONDANSETRON HCL 4 MG PO TABS: 4.0000 mg | ORAL_TABLET | Freq: Four times a day (QID) | ORAL | Status: DC

## 2015-06-02 NOTE — ED Provider Notes (Signed)
CSN: [id number]     Arrival date & time 06/02/15  1043 History   First MD Initiated Contact with Patient 06/02/15 1114     Chief Complaint  Patient presents with  . Abdominal Pain  . Emesis     HPI   30 year old female presents today with abdominal pain. Patient reports a history of chronic abdominal pain, notes that she's been seen by numerous providers including gastroenterology with no specific diagnosis. She reports today around 7 AM she had centralized abdominal pain and cramping with associated diarrhea and vomiting. She notes that the pain is "squeezing" and comes in waves. She reports this is typical of her abdominal pain. Patient notes that she was sick last week, started to feel better yesterday tolerating by mouth and Pepto-Bismol. Patient reports today she is unable to tolerate oral intake. She denies any fever or chills, changes in the color clarity or characteristics for urine. She notes that she's had dark stools recently, but adds that she's been taking Pepto-Bismol. She reports the pain does not radiate, no history of trauma to the abdomen, reports that she has not been sexually active since her last normal menstrual cycle.   Past Medical History  Diagnosis Date  . IBS (irritable bowel syndrome)   . Allergy   . Anemia   . Anxiety   . Depression    History reviewed. No pertinent past surgical history. Family History  Problem Relation Age of Onset  . Cancer Mother   . Diabetes Mother    Social History  Substance Use Topics  . Smoking status: Never Smoker   . Smokeless tobacco: Never Used  . Alcohol Use: No   OB History    No data available     Review of Systems  All other systems reviewed and are negative.   Allergies  Other; Ketamine; and Latex  Home Medications   Prior to Admission medications   Medication Sig Start Date End Date Taking? Authorizing Provider  bismuth subsalicylate (PEPTO BISMOL) 262 MG/15ML suspension Take 30 mLs by mouth every 6 (six)  hours as needed for diarrhea or loose stools.   Yes Historical Provider, MD  hyoscyamine (LEVSIN/SL) 0.125 MG SL tablet Place 1 tablet (0.125 mg total) under the tongue every 4 (four) hours as needed. 04/27/15   April Palumbo, MD  ondansetron (ZOFRAN ODT) 8 MG disintegrating tablet  ODT q8 hours prn nausea Patient taking differently: Take 8 mg by mouth every 8 (eight) hours as needed for nausea or vomiting.  ODT q8 hours prn nausea 04/27/15   April Palumbo, MD  ondansetron (ZOFRAN) 4 MG tablet Take 1 tablet (4 mg total) by mouth every 6 (six) hours. 06/02/15   Eyvonne Mechanic, PA-C   BP 118/59 mmHg  Pulse 71  Temp(Src) 97.9 F (36.6 C) (Oral)  Resp 18  SpO2 98%  LMP 05/19/2015   Physical Exam  Constitutional: She is oriented to person, place, and time. She appears well-developed and well-nourished.  HENT:  Head: Normocephalic and atraumatic.  Eyes: Conjunctivae are normal. Pupils are equal, round, and reactive to light. Right eye exhibits no discharge. Left eye exhibits no discharge. No scleral icterus.  Neck: Normal range of motion. No JVD present. No tracheal deviation present.  Cardiovascular: Normal rate, regular rhythm, normal heart sounds and intact distal pulses.  Exam reveals no gallop and no friction rub.   No murmur heard. Pulmonary/Chest: Effort normal and breath sounds normal. No stridor. No respiratory distress. She has no wheezes. She has no  rales. She exhibits no tenderness.  Abdominal: Soft. She exhibits no distension and no mass. There is tenderness. There is no rebound and no guarding.  Very minimally tender to the epigastric region   Musculoskeletal: Normal range of motion. She exhibits no edema or tenderness.  Neurological: She is alert and oriented to person, place, and time. Coordination normal.  Skin: Skin is warm and dry. No rash noted. No erythema. No pallor.  Psychiatric: She has a normal mood and affect. Her behavior is normal. Judgment and thought content  normal.  Nursing note and vitals reviewed.   ED Course  Procedures (including critical care time) Labs Review Labs Reviewed  COMPREHENSIVE METABOLIC PANEL - Abnormal; Notable for the following:    Potassium 3.4 (*)    CO2 20 (*)    Glucose, Bld 113 (*)    Total Protein 8.9 (*)    Albumin 5.2 (*)    All other components within normal limits  CBC - Abnormal; Notable for the following:    WBC 13.2 (*)    All other components within normal limits  URINALYSIS, ROUTINE W REFLEX MICROSCOPIC (NOT AT [facility]) - Abnormal; Notable for the following:    Color, Urine AMBER (*)    APPearance CLOUDY (*)    Hgb urine dipstick MODERATE (*)    Ketones, ur >80 (*)    Protein, ur 30 (*)    Leukocytes, UA SMALL (*)    All other components within normal limits  URINE MICROSCOPIC-ADD ON - Abnormal; Notable for the following:    Squamous Epithelial / LPF 6-30 (*)    Bacteria, UA FEW (*)    All other components within normal limits  LIPASE, BLOOD  I-STAT BETA HCG BLOOD, ED ([facility], [facility], [facility] ONLY)    Imaging Review Ct Abdomen Pelvis W Contrast  06/02/2015  CLINICAL DATA:  Severe central abdominal pain for 2 months. EXAM: CT ABDOMEN AND PELVIS WITH CONTRAST TECHNIQUE: Multidetector CT imaging of the abdomen and pelvis was performed using the standard protocol following bolus administration of intravenous contrast. CONTRAST:  OMNIPAQUE IOHEXOL 300 MG/ML SOLN, 25mL OMNIPAQUE IOHEXOL 300 MG/ML SOLN COMPARISON:  None. FINDINGS: No significant osseous abnormality is noted. Visualized lung bases are unremarkable. No gallstones are noted. The liver, spleen and pancreas appear normal. Adrenal glands and kidneys appear normal. No hydronephrosis or renal obstruction is noted. The appendix appears normal. There is no evidence of bowel obstruction. No abnormal fluid collection is noted. Uterus and urinary appear normal. Left ovary appears normal. 3.6 cm right ovarian cyst is noted. No significant adenopathy is noted.  IMPRESSION: 3.6 cm right ovarian cyst. No other abnormality seen in the abdomen or pelvis. Electronically Signed   By: Lupita Raider, M.D.   On: 06/02/2015 13:29   I have personally reviewed and evaluated these images and lab results as part of my medical decision-making.   EKG Interpretation None      MDM   Final diagnoses:  Epigastric pain    Labs: I-STAT beta-hCG, urinalysis, urine microscopic, lipase, CMP, CBC- WBC 13.2  Imaging: CT abdomen and pelvis with contrast- 3.6 cm right ovarian cyst  Consults:  Therapeutics: Dilaudid, Zofran, Maalox, Bentyl, Haldol  Discharge Meds: Zofran   Assessment/Plan: A 30 year old female presents today with acute on chronic abdominal pain. No significant findings on labs and will necessitate further evaluation or management here in the ED. Patient's symptoms were treated with above medication with good symptomatic improvement. She is instructed follow-up with gastroenterology for further  evaluation. Patient was made aware of all labs and diagnostic imaging results. She had no further questions or concerns at the time of discharge, or precautions given.          Eyvonne Mechanic, PA-C 06/03/15 1605  Cathren Laine, MD 06/04/15 [phone]

## 2015-06-02 NOTE — ED Notes (Signed)
Bed: WA08 Expected date:  Expected time:  Means of arrival:  Comments: 

## 2015-06-02 NOTE — ED Notes (Signed)
Pt states she is having nausea, diarrhea with black stools. Pt heaving at present.

## 2015-06-02 NOTE — ED Notes (Addendum)
Pt reports severe abd pain with n/v x 2 months.  Was just seen 2 weeks for same.  Pt reports hx of IBS.  Pt is dry heaving in triage.  Pt also reports "black" coffee ground stools, which she forgot to inform the EDP when she was seen 2 weeks ago. Pt reports she has been taking fiber, so she thought that it's what's causing her stools to turn black.  Denies any dizziness or lightheadedness at this time.

## 2015-06-02 NOTE — Discharge Instructions (Signed)
Dolor abdominal en adultos (Abdominal Pain, Adult) El dolor puede tener muchas causas. Normalmente la causa del dolor abdominal no es una enfermedad y Scientist, clinical (histocompatibility and immunogenetics) sin TEFL teacher. Frecuentemente puede controlarse y tratarse en casa. Su mdico le Medical sales representative examen fsico y posiblemente solicite anlisis de sangre y radiografas para ayudar a Chief Strategy Officer la gravedad de su dolor. Sin embargo, en [employer], debe transcurrir ms tiempo antes de que se pueda Clinical research associate una causa evidente del dolor. Antes de llegar a ese punto, es posible que su mdico no sepa si necesita ms pruebas o un tratamiento ms profundo. INSTRUCCIONES PARA EL CUIDADO EN EL HOGAR  Est atento al dolor para ver si hay cambios. Las siguientes indicaciones ayudarn a Architectural technologist que pueda sentir:  [place] solo medicamentos de venta libre o recetados, segn las indicaciones del mdico.  No tome laxantes a menos que se lo haya indicado su mdico.  Pruebe con Neomia Dear dieta lquida absoluta (caldo, t o agua) segn se lo indique su mdico. Introduzca gradualmente una dieta normal, segn su tolerancia. SOLICITE ATENCIN MDICA SI:  Tiene dolor abdominal sin explicacin.  Tiene dolor abdominal relacionado con nuseas o diarrea.  Tiene dolor cuando orina o defeca.  Experimenta dolor abdominal que lo despierta de noche.  Tiene dolor abdominal que empeora o mejora cuando come alimentos.  Tiene dolor abdominal que empeora cuando come alimentos grasosos.  Tiene fiebre. SOLICITE ATENCIN MDICA DE INMEDIATO SI:   El dolor no desaparece en un plazo mximo de 2horas.  No deja de (vomitar).  El Engineer, mining se siente solo en partes del abdomen, como el lado derecho o la parte inferior izquierda del abdomen.  Evaca materia fecal sanguinolenta o negra, de aspecto alquitranado. ASEGRESE DE QUE:  Comprende estas instrucciones.  Controlar su afeccin.  Recibir ayuda de inmediato si no mejora o si empeora.   Esta  informacin no tiene Theme park manager el consejo del mdico. Asegrese de hacerle al mdico cualquier pregunta que tenga.   Document Released: 04/13/2005 Document Revised: 05/04/2014 Elsevier Interactive Patient Education [employer]  Please read attached information. If you experience any new or worsening signs or symptoms please return to the emergency room for evaluation. Please follow-up with your primary care provider or specialist as discussed. Please use medication prescribed only as directed and discontinue taking if you have any concerning signs or symptoms.

## 2015-08-15 ENCOUNTER — Encounter: Payer: Self-pay | Admitting: Gastroenterology

## 2015-12-18 ENCOUNTER — Encounter: Payer: Self-pay | Admitting: *Deleted

## 2015-12-18 ENCOUNTER — Other Ambulatory Visit: Payer: Self-pay

## 2015-12-18 DIAGNOSIS — R102 Pelvic and perineal pain: Secondary | ICD-10-CM

## 2015-12-18 NOTE — Progress Notes (Signed)
Per provider recommendation pt needs US scheduled prior to GYN appt.  US scheduled for 12/27/15 @ 1345.  Front office to notify pt of US appt and GYN appt.

## 2015-12-27 ENCOUNTER — Ambulatory Visit (HOSPITAL_COMMUNITY): Payer: Medicaid Other | Attending: Obstetrics & Gynecology

## 2015-12-27 ENCOUNTER — Encounter (HOSPITAL_COMMUNITY): Payer: Self-pay

## 2016-01-16 ENCOUNTER — Encounter: Payer: Self-pay | Admitting: Nurse Practitioner

## 2016-01-30 ENCOUNTER — Encounter: Payer: Self-pay | Admitting: Obstetrics and Gynecology

## 2016-02-04 ENCOUNTER — Encounter: Payer: Self-pay | Admitting: Gastroenterology

## 2016-04-03 ENCOUNTER — Ambulatory Visit: Payer: Self-pay | Admitting: Gastroenterology

## 2016-04-03 ENCOUNTER — Other Ambulatory Visit: Payer: Self-pay

## 2016-10-27 ENCOUNTER — Encounter (HOSPITAL_COMMUNITY): Payer: Self-pay | Admitting: Emergency Medicine

## 2016-10-27 ENCOUNTER — Emergency Department (HOSPITAL_COMMUNITY)
Admission: EM | Admit: 2016-10-27 | Discharge: 2016-10-27 | Disposition: A | Discharge status: Home / Self Care | Payer: Medicaid Other | Attending: Emergency Medicine | Admitting: Emergency Medicine

## 2016-10-27 DIAGNOSIS — R1013 Epigastric pain: Secondary | ICD-10-CM

## 2016-10-27 DIAGNOSIS — R197 Diarrhea, unspecified: Secondary | ICD-10-CM | POA: Diagnosis not present

## 2016-10-27 DIAGNOSIS — K2900 Acute gastritis without bleeding: Secondary | ICD-10-CM | POA: Insufficient documentation

## 2016-10-27 DIAGNOSIS — Z79899 Other long term (current) drug therapy: Secondary | ICD-10-CM | POA: Insufficient documentation

## 2016-10-27 DIAGNOSIS — R112 Nausea with vomiting, unspecified: Secondary | ICD-10-CM | POA: Insufficient documentation

## 2016-10-27 LAB — COMPREHENSIVE METABOLIC PANEL
ALT: 19 U/L (ref 14–54)
AST: 26 U/L (ref 15–41)
Albumin: 5.1 g/dL — ABNORMAL HIGH (ref 3.5–5.0)
Alkaline Phosphatase: 60 U/L (ref 38–126)
Anion gap: 11 (ref 5–15)
BUN: 14 mg/dL (ref 6–20)
CO2: 26 mmol/L (ref 22–32)
Calcium: 10 mg/dL (ref 8.9–10.3)
Chloride: 102 mmol/L (ref 101–111)
Creatinine, Ser: 0.68 mg/dL (ref 0.44–1.00)
GFR calc Af Amer: >60 mL/min (ref >60)
GFR calc non Af Amer: >60 mL/min (ref >60)
Glucose, Bld: 113 mg/dL — ABNORMAL HIGH (ref 65–99)
Potassium: 3.3 mmol/L — ABNORMAL LOW (ref 3.5–5.1)
Sodium: 139 mmol/L (ref 135–145)
Total Bilirubin: 0.5 mg/dL (ref 0.3–1.2)
Total Protein: 9.1 g/dL — ABNORMAL HIGH (ref 6.5–8.1)

## 2016-10-27 LAB — CBC WITH DIFFERENTIAL/PLATELET
Basophils Absolute: 0 10*3/uL (ref 0.0–0.1)
Basophils Relative: 0 %
Eosinophils Absolute: 0 10*3/uL (ref 0.0–0.7)
Eosinophils Relative: 0 %
HCT: 37.9 % (ref 36.0–46.0)
Hemoglobin: 13 g/dL (ref 12.0–15.0)
Lymphocytes Relative: 13 %
Lymphs Abs: 1.3 10*3/uL (ref 0.7–4.0)
MCH: 31.5 pg (ref 26.0–34.0)
MCHC: 34.3 g/dL (ref 30.0–36.0)
MCV: 91.8 fL (ref 78.0–100.0)
Monocytes Absolute: 0.3 10*3/uL (ref 0.1–1.0)
Monocytes Relative: 3 %
Neutro Abs: 8.1 10*3/uL — ABNORMAL HIGH (ref 1.7–7.7)
Neutrophils Relative %: 84 %
Platelets: 331 10*3/uL (ref 150–400)
RBC: 4.13 MIL/uL (ref 3.87–5.11)
RDW: 12.6 % (ref 11.5–15.5)
WBC: 9.7 10*3/uL (ref 4.0–10.5)

## 2016-10-27 LAB — POC URINE PREG, ED: Preg Test, Ur: NEGATIVE

## 2016-10-27 LAB — LIPASE, BLOOD: Lipase: 20 U/L (ref 11–51)

## 2016-10-27 MED ORDER — LIDOCAINE VISCOUS 2 % MT SOLN: 15.0000 mL | Freq: Three times a day (TID) | OROMUCOSAL | 0 refills | Status: AC | PRN

## 2016-10-27 MED ORDER — GI COCKTAIL ~~LOC~~
30.0000 mL | Freq: Once | ORAL | Status: AC
  Administered 2016-10-27: 30 mL via ORAL
  Filled 2016-10-27: qty 30

## 2016-10-27 MED ORDER — ONDANSETRON HCL 4 MG/2ML IJ SOLN
4.0000 mg | Freq: Once | INTRAMUSCULAR | Status: AC
  Administered 2016-10-27: 4 mg via INTRAVENOUS
  Filled 2016-10-27: qty 2

## 2016-10-27 MED ORDER — ALUM & MAG HYDROXIDE-SIMETH 400-400-40 MG/5ML PO SUSP: 15.0000 mL | Freq: Four times a day (QID) | ORAL | 0 refills | Status: DC | PRN

## 2016-10-27 MED ORDER — SODIUM CHLORIDE 0.9 % IV BOLUS (SEPSIS)
1000.0000 mL | Freq: Once | INTRAVENOUS | Status: AC
  Administered 2016-10-27: 1000 mL via INTRAVENOUS

## 2016-10-27 NOTE — ED Notes (Signed)
MD at bedside unable to collect labs at this time 

## 2016-10-27 NOTE — ED Provider Notes (Addendum)
[facility]-EMERGENCY DEPT Provider Note   CSN: [id number] Arrival date & time: 10/27/16  0935     History   Chief Complaint Chief Complaint  Patient presents with  . Emesis  . Abdominal Pain    HPI Patricia Whitehead is a 31 y.o. female.  The history is provided by the patient.  Abdominal Pain   This is a new problem. Episode onset: 1 month. The problem occurs constantly. Progression since onset: fluctuating. Associated with: diagnosed with H. pylori currently being treated with triple tx. The pain is located in the epigastric region. The pain is severe. Associated symptoms include anorexia (but able to tolerate fluid intake), diarrhea (started this am) and vomiting. Pertinent negatives include fever, hematochezia, melena, nausea, constipation and dysuria. Nothing aggravates the symptoms. Nothing relieves the symptoms.   His fluid intake, recent travel. Patient was just changed from amoxicillin to Flagyl and tetracycline to treat H. Pylori due to inability to tolerate the amoxicillin.   Past Medical History:  Diagnosis Date  . Allergy   . Anemia   . Anxiety   . Depression   . IBS (irritable bowel syndrome)   . Menorrhagia with irregular cycle   . PTSD (post-traumatic stress disorder)     Patient Active Problem List   Diagnosis Date Noted  . NAUSEA 10/23/2008  . DYSPEPSIA 09/12/2008  . ABDOMINAL PAIN RIGHT UPPER QUADRANT 07/20/2007    History reviewed. No pertinent surgical history.  OB History    No data available       Home Medications    Prior to Admission medications   Medication Sig Start Date End Date Taking? Authorizing Provider  bismuth subsalicylate (PEPTO BISMOL) 262 MG/15ML suspension Take 30 mLs by mouth every 6 (six) hours as needed for diarrhea or loose stools.   Yes [provider]  bismuth-metronidazole-tetracycline (PYLERA) [phone] MG capsule Take 3 capsules by mouth 4 (four) times daily -  before meals and at bedtime.   Yes [provider]  LINZESS 290 MCG CAPS capsule Take 290 mcg by mouth daily. 08/01/16  Yes [provider]  norgestimate-ethinyl estradiol (SPRINTEC 28) 0.25-35 MG-MCG tablet Take 1 tablet by mouth daily.   Yes [provider]  ondansetron (ZOFRAN) 4 MG tablet Take 1 tablet (4 mg total) by mouth every 6 (six) hours. 06/02/15  Yes Hedges, Tinnie Gens, PA-C  alum & mag hydroxide-simeth (MAALOX MAX) 400-400-40 MG/5ML suspension Take 15 mLs by mouth every 6 (six) hours as needed for indigestion. 10/27/16   CardamaAmadeo Garnet, MD  hyoscyamine (LEVSIN/SL) 0.125 MG SL tablet Place 1 tablet (0.125 mg total) under the tongue every 4 (four) hours as needed. Patient not taking: Reported on 10/27/2016 04/27/15   Palumbo, April, MD  lidocaine (XYLOCAINE) 2 % solution Use as directed 15 mLs in the mouth or throat every 8 (eight) hours as needed (stomach pain). 10/27/16 11/03/16  Nira Conn, MD  ondansetron (ZOFRAN ODT) 8 MG disintegrating tablet 8mg  ODT q8 hours prn nausea Patient not taking: Reported on 10/27/2016 04/27/15   Nicanor Alcon, April, MD    Family History Family History  Problem Relation Age of Onset  . Cancer Mother   . Diabetes Mother     Social History Social History  Substance Use Topics  . Smoking status: Never Smoker  . Smokeless tobacco: Never Used  . Alcohol use No     Allergies   Other; Ketamine; and Latex   Review of Systems Review of Systems  Constitutional: Negative for fever.  Gastrointestinal:  Positive for abdominal pain, anorexia (but able to tolerate fluid intake), diarrhea (started this am) and vomiting. Negative for constipation, hematochezia, melena and nausea.  Genitourinary: Negative for dysuria.   All other systems are reviewed and are negative for acute change except as noted in the HPI  Physical Exam Updated Vital Signs BP (!) 145/100 (BP Location: Right Arm)   Pulse 81   Temp 97.9 F (36.6 C) (Oral)   Resp 16   SpO2 100%   Physical Exam   Constitutional: She is oriented to person, place, and time. She appears well-developed and well-nourished. No distress.  HENT:  Head: Normocephalic and atraumatic.  Nose: Nose normal.  Eyes: Conjunctivae and EOM are normal. Pupils are equal, round, and reactive to light. Right eye exhibits no discharge. Left eye exhibits no discharge. No scleral icterus.  Neck: Normal range of motion. Neck supple.  Cardiovascular: Normal rate and regular rhythm.  Exam reveals no gallop and no friction rub.   No murmur heard. Pulmonary/Chest: Effort normal and breath sounds normal. No stridor. No respiratory distress. She has no rales.  Abdominal: Soft. She exhibits no distension. There is no hepatosplenomegaly. There is tenderness in the right upper quadrant, epigastric area and left upper quadrant. There is no rigidity, no rebound, no guarding and no CVA tenderness. No hernia.  Musculoskeletal: She exhibits no edema or tenderness.  Neurological: She is alert and oriented to person, place, and time.  Skin: Skin is warm and dry. No rash noted. She is not diaphoretic. No erythema.  Psychiatric: She has a normal mood and affect.  Vitals reviewed.    ED Treatments / Results  Labs (all labs ordered are listed, but only abnormal results are displayed) Labs Reviewed  COMPREHENSIVE METABOLIC PANEL - Abnormal; Notable for the following:       Result Value   Potassium 3.3 (*)    Glucose, Bld 113 (*)    Total Protein 9.1 (*)    Albumin 5.1 (*)    All other components within normal limits  CBC WITH DIFFERENTIAL/PLATELET - Abnormal; Notable for the following:    Neutro Abs 8.1 (*)    All other components within normal limits  LIPASE, BLOOD  POC URINE PREG, ED    EKG  EKG Interpretation None       Radiology No results found.  EMERGENCY DEPARTMENT BILIARY ULTRASOUND INTERPRETATION "Study: Limited Abdominal Ultrasound of the Gallbladder and Common Bile Duct."  INDICATIONS: Abdominal pain and Nausea  and vomiting Indication: Multiple views of the gallbladder and common bile duct were obtained in real-time with a Multi-frequency probe."  PERFORMED BY:  Myself IMAGES ARCHIVED?: Yes LIMITATIONS: None INTERPRETATION: Normal, Gallbladder wall normal in thickness, Sonographic Murphy's sign abscent and Common bile duct normal in size   Procedures Procedures (including critical care time)  Medications Ordered in ED Medications  sodium chloride 0.9 % bolus 1,000 mL (1,000 mLs Intravenous New Bag/Given 10/27/16 1114)  ondansetron (ZOFRAN) injection 4 mg (4 mg Intravenous Given 10/27/16 1113)  gi cocktail (Maalox,Lidocaine,Donnatal) (30 mLs Oral Given 10/27/16 1117)     Initial Impression / Assessment and Plan / ED Course  I have reviewed the triage vital signs and the nursing notes.  Pertinent labs & imaging results that were available during my care of the patient were reviewed by me and considered in my medical decision making (see chart for details).     The patient appears well, in no acute distress, without evidence of toxicity or dehydration. In obvious discomfort. Upper  abdominal pain and tenderness. No signs of peritonitis. Bedside ultrasound without evidence to suggest acute cholecystitis. Labs grossly reassuring without evidence of pancreatitis, biliary obstruction, renal insufficiency or electrolyte derangements. No leukocytosis. Feel this is secondary to the patient's H pylori. Low suspicion for infectious colitis, including C. Difficile.  Patient was provided with IV fluids, nausea medicine and GI cocktail which provided near complete resolution of patient's abdominal discomfort. Patient able to tolerate by mouth intake.  Low suspicion for any other serious intra-abdominal inflammatory/infectious process.  The patient is safe for discharge with strict return precautions.    Final Clinical Impressions(s) / ED Diagnoses   Final diagnoses:  Nausea vomiting and diarrhea    Epigastric pain  Other acute gastritis without hemorrhage   Disposition: Discharge  Condition: Good  I have discussed the results, Dx and Tx plan with the patient who expressed understanding and agree(s) with the plan. Discharge instructions discussed at great length. The patient was given strict return precautions who verbalized understanding of the instructions. No further questions at time of discharge.    New Prescriptions   ALUM & MAG HYDROXIDE-SIMETH (MAALOX MAX) 400-400-40 MG/5ML SUSPENSION    Take 15 mLs by mouth every 6 (six) hours as needed for indigestion.   LIDOCAINE (XYLOCAINE) 2 % SOLUTION    Use as directed 15 mLs in the mouth or throat every 8 (eight) hours as needed (stomach pain).    Follow Up: [facility], [facility] [address] [place] [address] [phone]  Schedule an appointment as soon as possible for a visit  As needed        Cardama, Amadeo Garnet, MD 10/28/16 1546

## 2016-10-27 NOTE — ED Triage Notes (Signed)
Patient c/o N/V/D and generalized abdominal pain since yesterday. Reports taking abx x3 days after having positive h pylori test at PCP. Reports taking zofran with no relief.

## 2016-11-06 ENCOUNTER — Emergency Department (HOSPITAL_COMMUNITY): Payer: Medicaid Other

## 2016-11-06 ENCOUNTER — Emergency Department (HOSPITAL_COMMUNITY)
Admission: EM | Admit: 2016-11-06 | Discharge: 2016-11-06 | Disposition: A | Discharge status: Home / Self Care | Payer: Medicaid Other | Attending: Emergency Medicine | Admitting: Emergency Medicine

## 2016-11-06 ENCOUNTER — Encounter (HOSPITAL_COMMUNITY): Payer: Self-pay | Admitting: Emergency Medicine

## 2016-11-06 DIAGNOSIS — Y929 Unspecified place or not applicable: Secondary | ICD-10-CM | POA: Insufficient documentation

## 2016-11-06 DIAGNOSIS — Y999 Unspecified external cause status: Secondary | ICD-10-CM | POA: Insufficient documentation

## 2016-11-06 DIAGNOSIS — S0990XA Unspecified injury of head, initial encounter: Secondary | ICD-10-CM

## 2016-11-06 DIAGNOSIS — M542 Cervicalgia: Secondary | ICD-10-CM | POA: Diagnosis not present

## 2016-11-06 DIAGNOSIS — S3993XA Unspecified injury of pelvis, initial encounter: Secondary | ICD-10-CM | POA: Insufficient documentation

## 2016-11-06 DIAGNOSIS — Y939 Activity, unspecified: Secondary | ICD-10-CM | POA: Diagnosis not present

## 2016-11-06 DIAGNOSIS — T07XXXA Unspecified multiple injuries, initial encounter: Secondary | ICD-10-CM | POA: Diagnosis present

## 2016-11-06 DIAGNOSIS — N939 Abnormal uterine and vaginal bleeding, unspecified: Secondary | ICD-10-CM | POA: Diagnosis not present

## 2016-11-06 LAB — COMPREHENSIVE METABOLIC PANEL
ALT: 14 U/L (ref 14–54)
AST: 17 U/L (ref 15–41)
Albumin: 4.6 g/dL (ref 3.5–5.0)
Alkaline Phosphatase: 45 U/L (ref 38–126)
Anion gap: 10 (ref 5–15)
BUN: 10 mg/dL (ref 6–20)
CO2: 25 mmol/L (ref 22–32)
Calcium: 9.4 mg/dL (ref 8.9–10.3)
Chloride: 106 mmol/L (ref 101–111)
Creatinine, Ser: 0.89 mg/dL (ref 0.44–1.00)
GFR calc Af Amer: >60 mL/min (ref >60)
GFR calc non Af Amer: >60 mL/min (ref >60)
Glucose, Bld: 107 mg/dL — ABNORMAL HIGH (ref 65–99)
Potassium: 3.4 mmol/L — ABNORMAL LOW (ref 3.5–5.1)
Sodium: 141 mmol/L (ref 135–145)
Total Bilirubin: 0.3 mg/dL (ref 0.3–1.2)
Total Protein: 8.2 g/dL — ABNORMAL HIGH (ref 6.5–8.1)

## 2016-11-06 LAB — CBC WITH DIFFERENTIAL/PLATELET
Basophils Absolute: 0 10*3/uL (ref 0.0–0.1)
Basophils Relative: 0 %
Eosinophils Absolute: 0 10*3/uL (ref 0.0–0.7)
Eosinophils Relative: 0 %
HCT: 36.6 % (ref 36.0–46.0)
Hemoglobin: 12.7 g/dL (ref 12.0–15.0)
Lymphocytes Relative: 30 %
Lymphs Abs: 2.9 10*3/uL (ref 0.7–4.0)
MCH: 32.2 pg (ref 26.0–34.0)
MCHC: 34.7 g/dL (ref 30.0–36.0)
MCV: 92.7 fL (ref 78.0–100.0)
Monocytes Absolute: 1.2 10*3/uL — ABNORMAL HIGH (ref 0.1–1.0)
Monocytes Relative: 13 %
Neutro Abs: 5.4 10*3/uL (ref 1.7–7.7)
Neutrophils Relative %: 57 %
Platelets: 327 10*3/uL (ref 150–400)
RBC: 3.95 MIL/uL (ref 3.87–5.11)
RDW: 12.8 % (ref 11.5–15.5)
WBC: 9.4 10*3/uL (ref 4.0–10.5)

## 2016-11-06 LAB — URINALYSIS, ROUTINE W REFLEX MICROSCOPIC
Glucose, UA: 50 mg/dL — AB
Ketones, ur: 5 mg/dL — AB
Leukocytes, UA: NEGATIVE
Nitrite: NEGATIVE
Protein, ur: >=300 mg/dL — AB
Specific Gravity, Urine: 1.025 (ref 1.005–1.030)
pH: 5 (ref 5.0–8.0)

## 2016-11-06 LAB — POC URINE PREG, ED: Preg Test, Ur: NEGATIVE

## 2016-11-06 MED ORDER — ACETAMINOPHEN 325 MG PO TABS: 650.0000 mg | ORAL_TABLET | Freq: Once | ORAL | Status: DC

## 2016-11-06 MED ORDER — HYDROCODONE-ACETAMINOPHEN 5-325 MG PO TABS: 1.0000 | ORAL_TABLET | Freq: Four times a day (QID) | ORAL | 0 refills | Status: DC | PRN

## 2016-11-06 MED ORDER — ONDANSETRON 4 MG PO TBDP: 4.0000 mg | ORAL_TABLET | Freq: Three times a day (TID) | ORAL | 0 refills | Status: DC | PRN

## 2016-11-06 MED ORDER — ONDANSETRON 4 MG PO TBDP
4.0000 mg | ORAL_TABLET | Freq: Once | ORAL | Status: AC
  Administered 2016-11-06: 4 mg via ORAL
  Filled 2016-11-06: qty 1

## 2016-11-06 MED ORDER — IBUPROFEN 600 MG PO TABS: 600.0000 mg | ORAL_TABLET | Freq: Four times a day (QID) | ORAL | 0 refills | Status: DC | PRN

## 2016-11-06 MED ORDER — ACETAMINOPHEN 325 MG PO TABS
650.0000 mg | ORAL_TABLET | Freq: Once | ORAL | Status: AC
  Administered 2016-11-06: 650 mg via ORAL
  Filled 2016-11-06: qty 2

## 2016-11-06 MED ORDER — HYDROCODONE-ACETAMINOPHEN 5-325 MG PO TABS: 1.0000 | ORAL_TABLET | Freq: Once | ORAL | Status: DC

## 2016-11-06 NOTE — Discharge Instructions (Signed)
There were no acute abnormalities on the MRI or ultrasound. There was some protein, blood, and bilirubin found in your urine. This will need follow up evaluation and testing by your primary care provider. Please do this as soon as possible.   Expect your soreness to increase over the next 2-3 days. Take it easy, but do not lay around too much as this may make any stiffness worse.  Antiinflammatory medications: Take 600 mg of ibuprofen every 6 hours or 440 mg (over the counter dose) to 500 mg (prescription dose) of naproxen every 12 hours or for the next 3 days. After this time, these medications may be used as needed for pain. Take these medications with food to avoid upset stomach. Choose only one of these medications, do not take them together.  Vicodin for severe pain. Do not take the Vicodin while driving or performing other dangerous activities.  Lidocaine patches: These are available via either prescription or over-the-counter. The over-the-counter option may be more economical one and are likely just as effective. There are multiple over-the-counter brands, such as Salonpas.  Exercises: Be sure to perform the attached exercises starting with three times a week and working up to performing them daily. This is an essential part of preventing long term problems.   Zofran: Use the zofran as needed for nausea.   Follow up with a primary care provider for any future management of these complaints.  Please also follow up with OBGYN for your vaginal injuries. Refrain from sex or any vaginal penetration of any kind until evaluated by OBGYN.

## 2016-11-06 NOTE — ED Notes (Signed)
Vitals delayed due to patient being taken to testing.
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
## 2016-11-06 NOTE — ED Notes (Signed)
Pt given po fluids and food
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
## 2016-11-09 ENCOUNTER — Encounter: Payer: Self-pay | Admitting: Gastroenterology

## 2016-11-09 ENCOUNTER — Ambulatory Visit (INDEPENDENT_AMBULATORY_CARE_PROVIDER_SITE_OTHER): Payer: Medicaid Other | Admitting: Gastroenterology

## 2016-11-09 VITALS — BP 124/68 | Ht 62.0 in | Wt 111.0 lb

## 2016-11-09 DIAGNOSIS — A048 Other specified bacterial intestinal infections: Secondary | ICD-10-CM

## 2016-11-09 DIAGNOSIS — R112 Nausea with vomiting, unspecified: Secondary | ICD-10-CM | POA: Diagnosis not present

## 2016-11-09 NOTE — Patient Instructions (Addendum)
When you see your PCP in 2 weeks, please have them do a repeat H pylori breath test to make sure the infection cleared.  If you are age 32 or older, your body mass index should be between 23-30. Your Body mass index is 20.3 kg/m. If this is out of the aforementioned range listed, please consider follow up with your Primary Care Provider.  If you are age 32 or younger, your body mass index should be between 19-25. Your Body mass index is 20.3 kg/m. If this is out of the aformentioned range listed, please consider follow up with your Primary Care Provider.   Thank you for choosing [facility]  Dr Amada JupiterHenry Danis III
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
## 2016-12-16 ENCOUNTER — Encounter: Payer: Self-pay | Admitting: Obstetrics and Gynecology

## 2017-01-26 ENCOUNTER — Encounter: Payer: Self-pay | Admitting: Obstetrics and Gynecology

## 2017-01-26 NOTE — Progress Notes (Signed)
Patient did not keep GYN referral appointment for 01/26/2017.  Cornelia Copa MD Attending Center for [employer] [facility])

## 2017-06-01 ENCOUNTER — Encounter (HOSPITAL_COMMUNITY): Payer: Self-pay | Admitting: Emergency Medicine

## 2017-06-01 ENCOUNTER — Emergency Department (HOSPITAL_COMMUNITY)
Admission: EM | Admit: 2017-06-01 | Discharge: 2017-06-01 | Disposition: A | Discharge status: Home / Self Care | Payer: Medicaid Other | Attending: Emergency Medicine | Admitting: Emergency Medicine

## 2017-06-01 DIAGNOSIS — R109 Unspecified abdominal pain: Secondary | ICD-10-CM | POA: Insufficient documentation

## 2017-06-01 DIAGNOSIS — Z5321 Procedure and treatment not carried out due to patient leaving prior to being seen by health care provider: Secondary | ICD-10-CM | POA: Insufficient documentation

## 2017-06-01 LAB — URINALYSIS, ROUTINE W REFLEX MICROSCOPIC
Bilirubin Urine: NEGATIVE
Glucose, UA: NEGATIVE mg/dL
Ketones, ur: 20 mg/dL — AB
Leukocytes, UA: NEGATIVE
Nitrite: NEGATIVE
Protein, ur: NEGATIVE mg/dL
Specific Gravity, Urine: 1.016 (ref 1.005–1.030)
pH: 7 (ref 5.0–8.0)

## 2017-06-01 LAB — CBC
HCT: 37.6 % (ref 36.0–46.0)
Hemoglobin: 12.5 g/dL (ref 12.0–15.0)
MCH: 31.3 pg (ref 26.0–34.0)
MCHC: 33.2 g/dL (ref 30.0–36.0)
MCV: 94.2 fL (ref 78.0–100.0)
Platelets: 262 10*3/uL (ref 150–400)
RBC: 3.99 MIL/uL (ref 3.87–5.11)
RDW: 13.1 % (ref 11.5–15.5)
WBC: 9.4 10*3/uL (ref 4.0–10.5)

## 2017-06-01 LAB — COMPREHENSIVE METABOLIC PANEL
ALT: 14 U/L (ref 14–54)
AST: 19 U/L (ref 15–41)
Albumin: 4.6 g/dL (ref 3.5–5.0)
Alkaline Phosphatase: 53 U/L (ref 38–126)
Anion gap: 11 (ref 5–15)
BUN: 25 mg/dL — ABNORMAL HIGH (ref 6–20)
CO2: 24 mmol/L (ref 22–32)
Calcium: 9.4 mg/dL (ref 8.9–10.3)
Chloride: 105 mmol/L (ref 101–111)
Creatinine, Ser: 0.67 mg/dL (ref 0.44–1.00)
GFR calc Af Amer: >60 mL/min (ref >60)
GFR calc non Af Amer: >60 mL/min (ref >60)
Glucose, Bld: 112 mg/dL — ABNORMAL HIGH (ref 65–99)
Potassium: 3.4 mmol/L — ABNORMAL LOW (ref 3.5–5.1)
Sodium: 140 mmol/L (ref 135–145)
Total Bilirubin: 0.8 mg/dL (ref 0.3–1.2)
Total Protein: 8 g/dL (ref 6.5–8.1)

## 2017-06-01 LAB — I-STAT BETA HCG BLOOD, ED (MC, WL, AP ONLY): I-stat hCG, quantitative: <5 m[IU]/mL (ref <5)

## 2017-06-01 LAB — LIPASE, BLOOD: Lipase: 26 U/L (ref 11–51)

## 2017-06-01 MED ORDER — ONDANSETRON 4 MG PO TBDP
4.0000 mg | ORAL_TABLET | Freq: Once | ORAL | Status: DC | PRN
  Filled 2017-06-01: qty 1

## 2017-06-01 NOTE — ED Notes (Signed)
No answer from lobby  

## 2017-06-01 NOTE — ED Notes (Signed)
Pt not respond when called

## 2017-06-01 NOTE — ED Triage Notes (Signed)
Per pt, states she started having abdominal pain this morning around 0500-states she has vomited-states she is constipated and was unable to take her Linzess-doesn't think it is related

## 2017-06-01 NOTE — ED Notes (Signed)
No call from lobby x3.

## 2017-06-23 ENCOUNTER — Encounter: Payer: Self-pay | Admitting: Physician Assistant

## 2017-06-23 ENCOUNTER — Ambulatory Visit: Payer: Medicaid Other | Admitting: Physician Assistant

## 2017-06-23 VITALS — BP 118/64 | HR 66 | Ht 61.0 in | Wt 110.8 lb

## 2017-06-23 DIAGNOSIS — K581 Irritable bowel syndrome with constipation: Secondary | ICD-10-CM

## 2017-06-23 NOTE — Progress Notes (Signed)
Subjective:    Patient ID: Patricia Whitehead, female    DOB: [birth year], 32 y.o.   MRN: [id number]  HPI Patricia Whitehead is a pleasant 32 year old female established with Dr. Janne Whitehead. She is felt to have IBS. She was last seen in July 2018, at that time or with dyspeptic symptoms. She had tested positive for H. pylori antibody by her PCP in spring of 2018 and did receive a course of antibiotics. Follow-up breath testing was advised to confirm eradication. Patient says that was done and was negative. She has had mild problems with constipation and had been using Linzess 145 g as needed. Today today she comes in with complaints of upper abdominal pain which concerned her. She says a couple of weeks ago she had a bad GI virus which her daughter also had. This was associated with abdominal pain diarrhea and nausea. She said she had periods of time with severe abdominal pain, and says that it took a long time for her to get over this whole episode. She is not able to eat much due to her acute illness. She also developed diarrhea by the end of the illness, tried Linzess which made her stomach hurt so she stopped taking it. She finally took a bottle of mag citrate which worked purge her bowel and since then she says that her bowel movements have been much more regular. Her abdominal pain has subsided as has her nausea, and her appetite is back to normal.  Review of Systems Pertinent positive and negative review of systems were noted in the above HPI section.  All other review of systems was otherwise negative.  Outpatient Encounter Medications as of 06/23/2017  Medication Sig  . alum & mag hydroxide-simeth (MAALOX MAX) 400-400-40 MG/5ML suspension Take 15 mLs by mouth every 6 (six) hours as needed for indigestion.  Marland Kitchen doxycycline (VIBRA-TABS) 100 MG tablet Take 100 mg by mouth 2 (two) times daily.  Marland Kitchen linaclotide (LINZESS) 290 MCG CAPS capsule Take 290 mcg by mouth at bedtime as needed.  . norgestimate-ethinyl estradiol  (SPRINTEC 28) 0.25-35 MG-MCG tablet Take 1 tablet by mouth daily.  . ondansetron (ZOFRAN ODT) 4 MG disintegrating tablet Take 1 tablet (4 mg total) by mouth every 8 (eight) hours as needed for nausea or vomiting.   No facility-administered encounter medications on file as of 06/23/2017.    Allergies  Allergen Reactions  . Other Swelling    Muscle relaxer's   . Ketamine Other (See Comments)    seizures  . Latex Itching and Rash   Patient Active Problem List   Diagnosis Date Noted  . NAUSEA 10/23/2008  . DYSPEPSIA 09/12/2008  . ABDOMINAL PAIN RIGHT UPPER QUADRANT 07/20/2007   Social History   Socioeconomic History  . Marital status: Single    Spouse name: Not on file  . Number of children: Not on file  . Years of education: Not on file  . Highest education level: Not on file  Social Needs  . Financial resource strain: Not on file  . Food insecurity - worry: Not on file  . Food insecurity - inability: Not on file  . Transportation needs - medical: Not on file  . Transportation needs - non-medical: Not on file  Occupational History  . Not on file  Tobacco Use  . Smoking status: Never Smoker  . Smokeless tobacco: Never Used  Substance and Sexual Activity  . Alcohol use: No  . Drug use: No  . Sexual activity: Yes    Birth control/protection:  Pill  Other Topics Concern  . Not on file  Social History Narrative  . Not on file    Ms. Patricia Whitehead's family history includes Cancer in her mother; Diabetes in her mother.      Objective:    Vitals:   06/23/17 1046  BP: 118/64  Pulse: 66    Physical Exam well-developed young female in no acute distress, pleasant blood pressure 118/64 pulse 66, height 5 foot 1, weight 110, BMI 20.9. HEENT; nontraumatic normocephalic EOMI PERRLA sclera anicteric, Cardiovascular; regular rate and rhythm with S1-S2 no murmur or gallop, Pulmonary ;clear bilaterally, Abdomen; soft, she has very mild upper abdominal tenderness no guarding or rebound no  palpable mass or hepatosplenomegaly bowel sounds are present, Rectal ;exam not done, Extremities; no clubbing cyanosis or edema skin warm and dry, Neuropsych ;mood and affect appropriate       Assessment & Plan:   #43 32 year old female with IBS, who comes in today after a recent prolonged viral gastroenteritis with nausea vomiting abdominal pain and diarrhea. At this point all of her symptoms have subsided and she actually developed constipation which was alleviated with Mag Citrate Patient had been using Linzess on an as-needed basis , but this caused abdominal pain after the last couple of doses  #2 anxiety and PTSD #3 history of H. pylori, treated some or 2018 and eradication confirmed with negative breath test per PCP  Plan; Reassurance, we discussed prolonged nature of some viral gastroenteritis, alteration of gut flora and exacerbation of IBS symptoms associated with a viral illness. As Linzess is now causing abdominal pain, we'll stop Linzess and give her a trial of Amitiza 8 g once or twice daily as needed. She was provided with samples today call back for a prescription if she finds this helpful. We also discussed use of MiraLAX on a when necessary basis I do not think she needs any further GI work up at this time. She will follow up with Dr. Myrtie Whitehead or myself on an as-needed basis.  Greater than 50% of visit today was spent in counseling and management of IBS.  Patricia Whitehead S Patricia Vandenbrink PA-C 06/23/2017   Cc: Care, Patricia Kussmaul Tota*

## 2017-06-23 NOTE — Progress Notes (Signed)
Thank you for sending this case to me. I have reviewed the entire note, and the outlined plan seems appropriate.  Agreed.  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  Amada JupiterHenry Danis, MD

## 2017-06-23 NOTE — Patient Instructions (Signed)
We have given you some samples of Amitiza 8 mcg capsules.  Take 1 capsule once or twice daily as needed for episodes of constipation. If helpful, call back for a prescription ask for Patricia Whitehead's nurse- Beth.  [phone], choose option 2 for nurse and ask for Beth.   Follow up with Patricia Whitehead or Dr. Myrtie Neitheranis as needed.

## 2017-11-08 DIAGNOSIS — B369 Superficial mycosis, unspecified: Secondary | ICD-10-CM | POA: Diagnosis not present

## 2017-11-08 DIAGNOSIS — N760 Acute vaginitis: Secondary | ICD-10-CM | POA: Diagnosis not present

## 2017-11-08 DIAGNOSIS — N898 Other specified noninflammatory disorders of vagina: Secondary | ICD-10-CM | POA: Diagnosis not present

## 2017-11-15 DIAGNOSIS — J019 Acute sinusitis, unspecified: Secondary | ICD-10-CM | POA: Diagnosis not present

## 2017-11-15 DIAGNOSIS — K208 Other esophagitis: Secondary | ICD-10-CM | POA: Diagnosis not present

## 2017-11-17 DIAGNOSIS — B009 Herpesviral infection, unspecified: Secondary | ICD-10-CM | POA: Diagnosis not present

## 2017-11-17 DIAGNOSIS — J019 Acute sinusitis, unspecified: Secondary | ICD-10-CM | POA: Diagnosis not present

## 2017-11-17 DIAGNOSIS — K208 Other esophagitis: Secondary | ICD-10-CM | POA: Diagnosis not present

## 2017-11-22 DIAGNOSIS — N926 Irregular menstruation, unspecified: Secondary | ICD-10-CM | POA: Diagnosis not present

## 2017-11-25 ENCOUNTER — Other Ambulatory Visit: Payer: Self-pay | Admitting: Specialist

## 2017-11-25 DIAGNOSIS — Z1231 Encounter for screening mammogram for malignant neoplasm of breast: Secondary | ICD-10-CM

## 2017-12-20 ENCOUNTER — Ambulatory Visit: Payer: Self-pay

## 2018-01-05 DIAGNOSIS — R0982 Postnasal drip: Secondary | ICD-10-CM | POA: Diagnosis not present

## 2018-02-23 DIAGNOSIS — H1013 Acute atopic conjunctivitis, bilateral: Secondary | ICD-10-CM | POA: Diagnosis not present

## 2018-03-04 DIAGNOSIS — E559 Vitamin D deficiency, unspecified: Secondary | ICD-10-CM | POA: Diagnosis not present

## 2018-03-04 DIAGNOSIS — D508 Other iron deficiency anemias: Secondary | ICD-10-CM | POA: Diagnosis not present

## 2018-03-04 DIAGNOSIS — F419 Anxiety disorder, unspecified: Secondary | ICD-10-CM | POA: Diagnosis not present

## 2018-03-09 DIAGNOSIS — D649 Anemia, unspecified: Secondary | ICD-10-CM | POA: Diagnosis not present

## 2018-03-09 DIAGNOSIS — D508 Other iron deficiency anemias: Secondary | ICD-10-CM | POA: Diagnosis not present

## 2018-03-09 DIAGNOSIS — R5383 Other fatigue: Secondary | ICD-10-CM | POA: Diagnosis not present

## 2018-03-09 DIAGNOSIS — M25569 Pain in unspecified knee: Secondary | ICD-10-CM | POA: Diagnosis not present

## 2018-03-14 ENCOUNTER — Ambulatory Visit (INDEPENDENT_AMBULATORY_CARE_PROVIDER_SITE_OTHER): Payer: Self-pay | Admitting: Family Medicine

## 2018-03-16 ENCOUNTER — Ambulatory Visit (INDEPENDENT_AMBULATORY_CARE_PROVIDER_SITE_OTHER): Payer: Self-pay | Admitting: Family Medicine

## 2018-03-30 ENCOUNTER — Ambulatory Visit (INDEPENDENT_AMBULATORY_CARE_PROVIDER_SITE_OTHER): Payer: Medicaid Other | Admitting: Family Medicine

## 2018-03-30 ENCOUNTER — Encounter (INDEPENDENT_AMBULATORY_CARE_PROVIDER_SITE_OTHER): Payer: Self-pay | Admitting: Family Medicine

## 2018-03-30 DIAGNOSIS — M25561 Pain in right knee: Secondary | ICD-10-CM | POA: Diagnosis not present

## 2018-03-30 DIAGNOSIS — M25562 Pain in left knee: Secondary | ICD-10-CM | POA: Diagnosis not present

## 2018-03-30 MED ORDER — DICLOFENAC SODIUM 1 % TD GEL: 4.0000 g | Freq: Four times a day (QID) | TRANSDERMAL | 6 refills | Status: DC | PRN

## 2018-03-30 NOTE — Progress Notes (Signed)
   Office Visit Note   Patient: Patricia ForemanJanet Whitehead           Date of Birth: 1986-01-13           MRN: [id number] Visit Date: 03/30/2018 Requested by: Care, Jovita KussmaulEvans [facility] [address] [place] [id number] PCP: Care, Jovita KussmaulEvans [facility]  Subjective: Chief Complaint  Patient presents with  . pain bilateral feet, lt greater than rt - worse x 2 weeks    HPI: She is a 32 year old with bilateral foot pain.  Symptoms first started about 6 years ago.  She went to a foot specialist and had x-rays done which were unrevealing.  There was no injury to start her symptoms.  She had orthotics made with some improvement.  Symptoms were manageable until about 3 or 4 weeks ago when she began feeling severe pain from the arches of her feet up to her knees.  She has a burning sensation in her feet.  The pain is worse when she is off her feet lying in bed.  Denies any back pain, no sciatica symptoms.  No definite numbness.  Over-the-counter sports cream seems to help more than anything.  No personal or family history of diabetes, thyroid dysfunction or other unusual conditions.  Other than birth control pills, she is not on any long-term medications.                ROS: Otherwise noncontributory  Objective: Vital Signs: There were no vitals taken for this visit.  Physical Exam:  Legs: No effusion in the knees or ankles.  No rash on her skin, no edema.  Full range of motion of the knees and ankles with minimal pain.  Tender to palpation along the plantar surface of both feet in the arches.  2+ posterior tibial and dorsalis pedis pulses, normal sensation.  Normal temperature of her feet.  Imaging: None today.  Assessment & Plan: 1.  Chronic bilateral foot pain, etiology uncertain.  Symptoms sound like neuropathy. -We will draw some labs today.  If labs are normal, then x-rays of her feet and nerve conduction studies.   Follow-Up Instructions: No follow-ups on file.       Procedures: No procedures performed  No notes on file    PMFS History: Patient Active Problem List   Diagnosis Date Noted  . NAUSEA 10/23/2008  . DYSPEPSIA 09/12/2008  . ABDOMINAL PAIN RIGHT UPPER QUADRANT 07/20/2007   Past Medical History:  Diagnosis Date  . Allergy   . Anemia   . Anxiety   . Depression   . IBS (irritable bowel syndrome)   . Menorrhagia with irregular cycle   . PTSD (post-traumatic stress disorder)     Family History  Problem Relation Age of Onset  . Cancer Mother   . Diabetes Mother     History reviewed. No pertinent surgical history. Social History   Occupational History  . Not on file  Tobacco Use  . Smoking status: Never Smoker  . Smokeless tobacco: Never Used  Substance and Sexual Activity  . Alcohol use: No  . Drug use: No  . Sexual activity: Yes    Birth control/protection: Pill

## 2018-03-31 ENCOUNTER — Telehealth (INDEPENDENT_AMBULATORY_CARE_PROVIDER_SITE_OTHER): Payer: Self-pay | Admitting: Family Medicine

## 2018-03-31 DIAGNOSIS — D803 Selective deficiency of immunoglobulin G [IgG] subclasses: Secondary | ICD-10-CM | POA: Diagnosis not present

## 2018-03-31 NOTE — Telephone Encounter (Signed)
Labs are notable for the following:  Vitamin B12 is slightly elevated at 1371.  Is she taking a B vitamin supplement?  If so, then this is a good level.  If she is not on anything, then we need to do some more investigation.  Hemoglobin and hematocrit are slightly low at 10.9 and 32.9, respectively.  This indicates mild anemia of some sort, but since ferritin level is normal it is probably not due to iron deficiency.  I recommend rechecking blood counts in about 3 months to be sure everything has normalized.  No other treatment for now.  Vitamin D is very low at 19.  We want this to be 50-80.  I recommend taking vitamin D3, 5000 IU tablets, 2 of them daily for 3 months and then 1 daily long-term after that.  This could explain chronic foot symptoms.  Everything else looks good.  We can mail her a copy if she would like, or she can sign up for MyChart to view them.

## 2018-03-31 NOTE — Telephone Encounter (Signed)
I spoke with the patient, giving her all the lab results.  She will start the vitamin D3 soon.  FYI:   She is taking a multivitamin daily, but had been taking vitamin B12 in the past (stopped some months ago. She saw her PCP today - she has a history of anemia, so she is being referred by the PCP to a hematologist. She will start the vitamin D3 soon - she wrote the instructions down of how to take it.

## 2018-04-01 LAB — CBC WITH DIFFERENTIAL/PLATELET
Basophils Absolute: 31 cells/uL (ref 0–200)
Basophils Relative: 0.4 %
Eosinophils Absolute: 62 cells/uL (ref 15–500)
Eosinophils Relative: 0.8 %
HCT: 32.9 % — ABNORMAL LOW (ref 35.0–45.0)
Hemoglobin: 10.9 g/dL — ABNORMAL LOW (ref 11.7–15.5)
Lymphs Abs: 1994 cells/uL (ref 850–3900)
MCH: 31.1 pg (ref 27.0–33.0)
MCHC: 33.1 g/dL (ref 32.0–36.0)
MCV: 93.7 fL (ref 80.0–100.0)
MPV: 11.2 fL (ref 7.5–12.5)
Monocytes Relative: 6.5 %
Neutro Abs: 5113 cells/uL (ref 1500–7800)
Neutrophils Relative %: 66.4 %
Platelets: 259 10*3/uL (ref 140–400)
RBC: 3.51 10*6/uL — ABNORMAL LOW (ref 3.80–5.10)
RDW: 11.9 % (ref 11.0–15.0)
Total Lymphocyte: 25.9 %
WBC mixed population: 501 cells/uL (ref 200–950)
WBC: 7.7 10*3/uL (ref 3.8–10.8)

## 2018-04-01 LAB — COMPREHENSIVE METABOLIC PANEL
AG Ratio: 1.4 (calc) (ref 1.0–2.5)
ALT: 14 U/L (ref 6–29)
AST: 14 U/L (ref 10–30)
Albumin: 4.2 g/dL (ref 3.6–5.1)
Alkaline phosphatase (APISO): 45 U/L (ref 33–115)
BUN: 15 mg/dL (ref 7–25)
CO2: 22 mmol/L (ref 20–32)
Calcium: 9.4 mg/dL (ref 8.6–10.2)
Chloride: 102 mmol/L (ref 98–110)
Creat: 0.63 mg/dL (ref 0.50–1.10)
Globulin: 3 g/dL (calc) (ref 1.9–3.7)
Glucose, Bld: 81 mg/dL (ref 65–99)
Potassium: 4.1 mmol/L (ref 3.5–5.3)
Sodium: 137 mmol/L (ref 135–146)
Total Bilirubin: 0.2 mg/dL (ref 0.2–1.2)
Total Protein: 7.2 g/dL (ref 6.1–8.1)

## 2018-04-01 LAB — C-REACTIVE PROTEIN: CRP: 1.2 mg/L (ref <8.0)

## 2018-04-01 LAB — THYROID PANEL WITH TSH
Free Thyroxine Index: 2.4 (ref 1.4–3.8)
T3 Uptake: 24 % (ref 22–35)
T4, Total: 9.8 ug/dL (ref 5.1–11.9)
TSH: 0.95 mIU/L

## 2018-04-01 LAB — ANA: Anti Nuclear Antibody(ANA): NEGATIVE

## 2018-04-01 LAB — VITAMIN B12: Vitamin B-12: 1371 pg/mL — ABNORMAL HIGH (ref 200–1100)

## 2018-04-01 LAB — VITAMIN D 25 HYDROXY (VIT D DEFICIENCY, FRACTURES): Vit D, 25-Hydroxy: 19 ng/mL — ABNORMAL LOW (ref 30–100)

## 2018-04-01 LAB — FERRITIN: Ferritin: 93 ng/mL (ref 16–154)

## 2018-04-12 DIAGNOSIS — M7989 Other specified soft tissue disorders: Secondary | ICD-10-CM | POA: Diagnosis not present

## 2018-04-12 DIAGNOSIS — M722 Plantar fascial fibromatosis: Secondary | ICD-10-CM | POA: Diagnosis not present

## 2018-04-14 DIAGNOSIS — J302 Other seasonal allergic rhinitis: Secondary | ICD-10-CM | POA: Diagnosis not present

## 2018-05-06 DIAGNOSIS — R61 Generalized hyperhidrosis: Secondary | ICD-10-CM | POA: Diagnosis not present

## 2018-05-19 DIAGNOSIS — M722 Plantar fascial fibromatosis: Secondary | ICD-10-CM | POA: Diagnosis not present

## 2018-05-23 DIAGNOSIS — F331 Major depressive disorder, recurrent, moderate: Secondary | ICD-10-CM | POA: Diagnosis not present

## 2018-06-03 DIAGNOSIS — M722 Plantar fascial fibromatosis: Secondary | ICD-10-CM | POA: Diagnosis not present

## 2018-06-15 DIAGNOSIS — Z130 Encounter for screening for diseases of the blood and blood-forming organs and certain disorders involving the immune mechanism: Secondary | ICD-10-CM | POA: Diagnosis not present

## 2018-06-15 DIAGNOSIS — D649 Anemia, unspecified: Secondary | ICD-10-CM | POA: Diagnosis not present

## 2018-06-29 DIAGNOSIS — M722 Plantar fascial fibromatosis: Secondary | ICD-10-CM | POA: Diagnosis not present

## 2018-09-12 DIAGNOSIS — N760 Acute vaginitis: Secondary | ICD-10-CM | POA: Diagnosis not present

## 2018-09-12 DIAGNOSIS — Z6822 Body mass index (BMI) 22.0-22.9, adult: Secondary | ICD-10-CM | POA: Diagnosis not present

## 2018-09-12 DIAGNOSIS — J309 Allergic rhinitis, unspecified: Secondary | ICD-10-CM | POA: Diagnosis not present

## 2018-09-12 DIAGNOSIS — J0100 Acute maxillary sinusitis, unspecified: Secondary | ICD-10-CM | POA: Diagnosis not present

## 2018-09-23 DIAGNOSIS — Z760 Encounter for issue of repeat prescription: Secondary | ICD-10-CM | POA: Diagnosis not present

## 2018-09-23 DIAGNOSIS — J309 Allergic rhinitis, unspecified: Secondary | ICD-10-CM | POA: Diagnosis not present

## 2018-09-23 DIAGNOSIS — Z6822 Body mass index (BMI) 22.0-22.9, adult: Secondary | ICD-10-CM | POA: Diagnosis not present

## 2018-09-27 DIAGNOSIS — I10 Essential (primary) hypertension: Secondary | ICD-10-CM | POA: Diagnosis not present

## 2018-09-27 DIAGNOSIS — Z6822 Body mass index (BMI) 22.0-22.9, adult: Secondary | ICD-10-CM | POA: Diagnosis not present

## 2018-09-27 DIAGNOSIS — J309 Allergic rhinitis, unspecified: Secondary | ICD-10-CM | POA: Diagnosis not present

## 2018-09-27 DIAGNOSIS — R5383 Other fatigue: Secondary | ICD-10-CM | POA: Diagnosis not present

## 2018-09-27 DIAGNOSIS — K900 Celiac disease: Secondary | ICD-10-CM | POA: Diagnosis not present

## 2018-09-27 DIAGNOSIS — Z803 Family history of malignant neoplasm of breast: Secondary | ICD-10-CM | POA: Diagnosis not present

## 2018-09-27 DIAGNOSIS — R030 Elevated blood-pressure reading, without diagnosis of hypertension: Secondary | ICD-10-CM | POA: Diagnosis not present

## 2018-09-27 DIAGNOSIS — N760 Acute vaginitis: Secondary | ICD-10-CM | POA: Diagnosis not present

## 2018-09-28 ENCOUNTER — Encounter: Payer: Self-pay | Admitting: Genetic Counselor

## 2018-10-07 DIAGNOSIS — R197 Diarrhea, unspecified: Secondary | ICD-10-CM | POA: Diagnosis not present

## 2018-10-07 DIAGNOSIS — Z6822 Body mass index (BMI) 22.0-22.9, adult: Secondary | ICD-10-CM | POA: Diagnosis not present

## 2018-10-07 DIAGNOSIS — N760 Acute vaginitis: Secondary | ICD-10-CM | POA: Diagnosis not present

## 2018-10-07 DIAGNOSIS — R194 Change in bowel habit: Secondary | ICD-10-CM | POA: Diagnosis not present

## 2018-10-07 DIAGNOSIS — L659 Nonscarring hair loss, unspecified: Secondary | ICD-10-CM | POA: Diagnosis not present

## 2018-10-07 DIAGNOSIS — J309 Allergic rhinitis, unspecified: Secondary | ICD-10-CM | POA: Diagnosis not present

## 2018-10-18 DIAGNOSIS — B373 Candidiasis of vulva and vagina: Secondary | ICD-10-CM | POA: Diagnosis not present

## 2018-10-18 DIAGNOSIS — J069 Acute upper respiratory infection, unspecified: Secondary | ICD-10-CM | POA: Diagnosis not present

## 2018-10-18 DIAGNOSIS — R05 Cough: Secondary | ICD-10-CM | POA: Diagnosis not present

## 2018-10-18 DIAGNOSIS — Z20828 Contact with and (suspected) exposure to other viral communicable diseases: Secondary | ICD-10-CM | POA: Diagnosis not present

## 2018-10-21 DIAGNOSIS — Z6821 Body mass index (BMI) 21.0-21.9, adult: Secondary | ICD-10-CM | POA: Diagnosis not present

## 2018-10-21 DIAGNOSIS — R05 Cough: Secondary | ICD-10-CM | POA: Diagnosis not present

## 2018-10-21 DIAGNOSIS — R197 Diarrhea, unspecified: Secondary | ICD-10-CM | POA: Diagnosis not present

## 2018-11-08 ENCOUNTER — Telehealth: Payer: Self-pay | Admitting: Genetic Counselor

## 2018-11-08 NOTE — Telephone Encounter (Signed)
Called pt per 7/13 sch message - no answer - left message for patient to call back to reschedule appt.

## 2018-11-08 NOTE — Telephone Encounter (Signed)
Patient returned phone call regarding voicemail that was left, patient needs 07/15 appointment to be cancelled and will reschedule on another date.

## 2018-11-08 NOTE — Telephone Encounter (Signed)
Opened by accident

## 2018-11-09 ENCOUNTER — Inpatient Hospital Stay: Payer: Medicaid Other | Admitting: Genetic Counselor

## 2018-11-09 ENCOUNTER — Encounter: Payer: Medicaid Other | Admitting: Genetic Counselor

## 2018-11-09 DIAGNOSIS — J309 Allergic rhinitis, unspecified: Secondary | ICD-10-CM | POA: Diagnosis not present

## 2018-11-09 DIAGNOSIS — Z205 Contact with and (suspected) exposure to viral hepatitis: Secondary | ICD-10-CM | POA: Diagnosis not present

## 2018-11-09 DIAGNOSIS — R030 Elevated blood-pressure reading, without diagnosis of hypertension: Secondary | ICD-10-CM | POA: Diagnosis not present

## 2018-11-09 DIAGNOSIS — Z20828 Contact with and (suspected) exposure to other viral communicable diseases: Secondary | ICD-10-CM | POA: Diagnosis not present

## 2018-11-09 DIAGNOSIS — Z6822 Body mass index (BMI) 22.0-22.9, adult: Secondary | ICD-10-CM | POA: Diagnosis not present

## 2018-11-10 ENCOUNTER — Telehealth: Payer: Self-pay | Admitting: Gastroenterology

## 2018-11-10 NOTE — Telephone Encounter (Signed)
Patient return your call °

## 2018-11-11 ENCOUNTER — Encounter: Payer: Self-pay | Admitting: Gastroenterology

## 2018-11-11 ENCOUNTER — Ambulatory Visit (INDEPENDENT_AMBULATORY_CARE_PROVIDER_SITE_OTHER): Payer: Medicaid Other | Admitting: Gastroenterology

## 2018-11-11 ENCOUNTER — Other Ambulatory Visit: Payer: Self-pay

## 2018-11-11 DIAGNOSIS — R12 Heartburn: Secondary | ICD-10-CM

## 2018-11-11 DIAGNOSIS — R110 Nausea: Secondary | ICD-10-CM | POA: Diagnosis not present

## 2018-11-11 DIAGNOSIS — K581 Irritable bowel syndrome with constipation: Secondary | ICD-10-CM

## 2018-11-11 NOTE — Telephone Encounter (Signed)
Left message to pre screen

## 2018-11-11 NOTE — Progress Notes (Signed)
This patient contacted our office requesting a physician telemedicine consultation regarding clinical questions and/or test results. Due to COVID restrictions, this was felt to be the most appropriate method of patient evaluation.   Participants on the conference : myself and patient   The patient consented to this consultation and was aware that a charge will be placed through their insurance.  They were also made aware of the limitations of telemedicine.  I was in my office and the patient was at home.   Encounter time:  Total time 25 minutes, with 20 minutes spent with patient on Doximity   _____________________________________________________________________________________________               Patricia Whitehead GI Progress Note  Chief Complaint: IBS  Subjective  History: Seen by me July 2018 for a recent acute infectious illness that occurred after returning home from a cruise.  Her primary care provider reportedly diagnosed H. pylori by breath test and put her on Pylera.  My advice was to confirm H. pylori eradication with repeat breath test, as her symptoms had resolved. She was seen by our PA in February 2019 for prolonged upper abdominal pain and diarrhea after a probable infectious illness contracted from her daughter.  This was felt likely due to underlying IBS (constipation-predominant, Dx by Dr. Deatra Whitehead in 2010 - EGD done then).  At that time, she reported that the repeat H. pylori testing by primary care had been negative.  Patricia Whitehead tells me her PCP wanted her to have a reevaluation out of concern that she seems susceptible to GI infections when her children get them, and that she seems to have prolonged symptoms for weeks afterwards.  This has apparently happened a couple more times since her last evaluation by [place] about 18 months ago.  She has been feeling well for many months now, and says her bowel habits became more regular with a healthier diet and regular use of MiraLAX.  She  has not needed the Linzess for well over a year now.  She denies nausea vomiting early satiety weight loss or rectal bleeding. Lastly, for about the last 2 years she will have heartburn requiring almost daily use of omeprazole or Tums.  She denies dysphagia or odynophagia.  ROS: Cardiovascular:  no chest pain Respiratory: no dyspnea She has been troubled by chronic sinus congestion and apparently recurrent episodes of sinusitis requiring antibiotics.  She believes she will be referred to an ENT about this.   Remainder of systems negative except as above The patient's Past Medical, Family and Social History were reviewed and are on file in the EMR.  Objective:  Med list reviewed  Current Outpatient Medications:  .  alum & mag hydroxide-simeth (MAALOX MAX) 400-400-40 MG/5ML suspension, Take 15 mLs by mouth every 6 (six) hours as needed for indigestion. (Patient not taking: Reported on 03/30/2018), Disp: 355 mL, Rfl: 0 .  diclofenac sodium (VOLTAREN) 1 % GEL, Apply 4 g topically 4 (four) times daily as needed., Disp: 500 g, Rfl: 6 .  doxycycline (VIBRA-TABS) 100 MG tablet, Take 100 mg by mouth 2 (two) times daily., Disp: , Rfl:  .  ibuprofen (ADVIL,MOTRIN) 400 MG tablet, Take 400 mg by mouth every 4 (four) hours as needed., Disp: , Rfl: 0 .  linaclotide (LINZESS) 290 MCG CAPS capsule, Take 290 mcg by mouth at bedtime as needed., Disp: , Rfl:  .  norgestimate-ethinyl estradiol (SPRINTEC 28) 0.25-35 MG-MCG tablet, Take 1 tablet by mouth daily., Disp: , Rfl:  .  ondansetron (ZOFRAN ODT) 4 MG disintegrating tablet, Take 1 tablet (4 mg total) by mouth every 8 (eight) hours as needed for nausea or vomiting. (Patient not taking: Reported on 03/30/2018), Disp: 20 tablet, Rfl: 0    No exam-virtual visit  Well-appearing, normal vocal quality  No data for review   @ASSESSMENTPLANBEGIN @ Assessment: Encounter Diagnoses  Name Primary?  . Irritable bowel syndrome with constipation Yes  . Heartburn    . Nausea without vomiting    I suspect she most likely has prolonged symptoms from acute GI infections because of underlying IBS.  However, with her reported chronic sinus symptoms, raises question of IgA deficiency.  Celiac sprue less likely without chronic diarrhea. Patricia Whitehead says her PCP wanted her to see us for consideration of endoscopic procedures because of her underlying IBS and tendency for prolonged postinfectious symptoms.  However, I do not think that is likely to be revealing. Her IBS symptoms are under the best control they have been in many years.  Plan: TTG IgA antibody Total IgA level .   Patricia Whitehead

## 2018-11-17 ENCOUNTER — Other Ambulatory Visit (INDEPENDENT_AMBULATORY_CARE_PROVIDER_SITE_OTHER): Payer: Medicaid Other

## 2018-11-17 ENCOUNTER — Other Ambulatory Visit: Payer: Self-pay

## 2018-11-17 DIAGNOSIS — K581 Irritable bowel syndrome with constipation: Secondary | ICD-10-CM

## 2018-11-17 DIAGNOSIS — R110 Nausea: Secondary | ICD-10-CM | POA: Diagnosis not present

## 2018-11-17 LAB — IGA: IgA: 266 mg/dL (ref 68–378)

## 2018-11-17 NOTE — Progress Notes (Signed)
Lab called and entered labs from 7-17 office visit.

## 2018-11-18 DIAGNOSIS — L659 Nonscarring hair loss, unspecified: Secondary | ICD-10-CM | POA: Diagnosis not present

## 2018-11-18 DIAGNOSIS — Z6822 Body mass index (BMI) 22.0-22.9, adult: Secondary | ICD-10-CM | POA: Diagnosis not present

## 2018-11-18 DIAGNOSIS — J309 Allergic rhinitis, unspecified: Secondary | ICD-10-CM | POA: Diagnosis not present

## 2018-11-18 DIAGNOSIS — Z3041 Encounter for surveillance of contraceptive pills: Secondary | ICD-10-CM | POA: Diagnosis not present

## 2018-11-18 DIAGNOSIS — K219 Gastro-esophageal reflux disease without esophagitis: Secondary | ICD-10-CM | POA: Diagnosis not present

## 2018-11-18 DIAGNOSIS — I10 Essential (primary) hypertension: Secondary | ICD-10-CM | POA: Diagnosis not present

## 2018-11-18 LAB — TISSUE TRANSGLUTAMINASE, IGA: (tTG) Ab, IgA: 1 U/mL

## 2018-11-21 ENCOUNTER — Telehealth: Payer: Self-pay | Admitting: Genetic Counselor

## 2018-11-21 ENCOUNTER — Telehealth: Payer: Self-pay | Admitting: Gastroenterology

## 2018-11-21 NOTE — Telephone Encounter (Signed)
Patient is returning your regarding her labs

## 2018-11-21 NOTE — Telephone Encounter (Signed)
Left message for the patient to call back.

## 2018-11-21 NOTE — Telephone Encounter (Signed)
Confirmed with patient new genetics appointment with Raquel Sarna 8/6 @ 3 pm via webex.

## 2018-11-21 NOTE — Telephone Encounter (Signed)
Patient notified of lab results

## 2018-11-30 ENCOUNTER — Telehealth: Payer: Self-pay | Admitting: Genetic Counselor

## 2018-11-30 NOTE — Telephone Encounter (Signed)
Called patient regarding upcoming Webex appointment, left a voicemail. This will be considered a walk-in visit due to no confirmation to set this up as a virtual visit. °

## 2018-12-01 ENCOUNTER — Inpatient Hospital Stay: Payer: Medicaid Other

## 2018-12-01 ENCOUNTER — Telehealth: Payer: Self-pay | Admitting: Genetic Counselor

## 2018-12-01 ENCOUNTER — Inpatient Hospital Stay: Payer: Medicaid Other | Admitting: Genetic Counselor

## 2018-12-01 NOTE — Telephone Encounter (Signed)
Returned call to patient re rescheduling today's appointment. Per patient moved 8/6 genetics appointment to 8/17 as webex. Webex per patient request.

## 2018-12-08 ENCOUNTER — Telehealth: Payer: Self-pay | Admitting: Genetic Counselor

## 2018-12-08 NOTE — Telephone Encounter (Signed)
Called patient regarding upcoming Webex appointment, left a voicemail. This will be a virtual visit per notes, e-mail has been sent.

## 2018-12-09 ENCOUNTER — Telehealth: Payer: Self-pay | Admitting: Genetic Counselor

## 2018-12-12 ENCOUNTER — Encounter: Payer: Self-pay | Admitting: Genetic Counselor

## 2018-12-12 ENCOUNTER — Inpatient Hospital Stay: Payer: Medicaid Other | Attending: Genetic Counselor | Admitting: Genetic Counselor

## 2018-12-12 ENCOUNTER — Inpatient Hospital Stay: Payer: Medicaid Other

## 2018-12-12 DIAGNOSIS — Z803 Family history of malignant neoplasm of breast: Secondary | ICD-10-CM | POA: Diagnosis not present

## 2018-12-12 NOTE — Progress Notes (Signed)
REFERRING PROVIDER: Jaynee Eagles, PA-C [address] [place],  [place] [address]  PRIMARY PROVIDER:  [facility], [facility]  PRIMARY REASON FOR VISIT:  1. Family history of breast cancer      HISTORY OF PRESENT ILLNESS:   I connected with@ [place] on 12/12/2018 at 9:00 AM EDT by Webex video conference and verified that I am speaking with the correct person using two identifiers.   Patient location: Home Provider location: Office  Patricia Whitehead, a 33 y.o. female, was seen for a [facility] cancer genetics consultation at the request of Dr. Bess Harvest due to a family history of breast cancer.  Patricia Whitehead presents to clinic today to discuss the possibility of a hereditary predisposition to cancer, genetic testing, and to further clarify her future cancer risks, as well as potential cancer risks for family members.   Patricia Whitehead is a 33 y.o. female with no personal history of cancer.    CANCER HISTORY:  Oncology History   No history exists.     RISK FACTORS:  Menarche was at age 22.  First live birth at age 51.  OCP use for approximately 14 years.  Ovaries intact: yes.  Hysterectomy: no.  Menopausal status: premenopausal.  HRT use: 0 years. Colonoscopy: yes; IBS. Mammogram within the last year: no. Number of breast biopsies: 0. Up to date with pelvic exams: yes. Any excessive radiation exposure in the past: no  Past Medical History:  Diagnosis Date   Allergy    Anemia    Anxiety    Depression    Family history of breast cancer    IBS (irritable bowel syndrome)    Menorrhagia with irregular cycle    PTSD (post-traumatic stress disorder)     No past surgical history on file.  Social History   Socioeconomic History   Marital status: Single    Spouse name: Not on file   Number of children: Not on file   Years of education: Not on file   Highest education level: Not on file  Occupational History   Not on file  Social Needs   Financial resource  strain: Not on file   Food insecurity    Worry: Not on file    Inability: Not on file   Transportation needs    Medical: Not on file    Non-medical: Not on file  Tobacco Use   Smoking status: Never Smoker   Smokeless tobacco: Never Used  Substance and Sexual Activity   Alcohol use: No   Drug use: No   Sexual activity: Yes    Birth control/protection: Pill  Lifestyle   Physical activity    Days per week: Not on file    Minutes per session: Not on file   Stress: Not on file  Relationships   Social connections    Talks on phone: Not on file    Gets together: Not on file    Attends religious service: Not on file    Active member of club or organization: Not on file    Attends meetings of clubs or organizations: Not on file    Relationship status: Not on file  Other Topics Concern   Not on file  Social History Narrative   Not on file     FAMILY HISTORY:  We obtained a detailed, 4-generation family history.  Significant diagnoses are listed below: Family History  Problem Relation Age of Onset   Diabetes Mother    Breast cancer Mother 57  The patient has one daughter who is cancer free. She has two sisters and a brother who are all cancer free.  Both parents are living.  The patient's mother had stage III breast cancer at 56.  She has five brothers who are cancer free.  Her father is deceased from old age and her mother is living.  The patient's father is living. He has three sisters and a brother.  They are all cancer free.  The paternal grandparents are living and cancer free.  Ms. Sida is unaware of previous family history of genetic testing for hereditary cancer risks. Patient's maternal ancestors are of [place] descent, and paternal ancestors are of [place] descent. There is no reported Ashkenazi Jewish ancestry. There is no known consanguinity.  GENETIC COUNSELING ASSESSMENT: Ms. Streeter is a 33 y.o. female with a family history of breast cancer which  is somewhat suggestive of a hereditary cancer syndrome and predisposition to cancer given her mother's young onset of breast cancer. We, therefore, discussed and recommended the following at today's visit.   DISCUSSION: We discussed that 5 - 10% of breast cancer is hereditary, with most cases associated with BRCA mutations.  There are other genes that can be associated with hereditary breast cancer syndromes.  These include ATM, CHEK2 and PALB2. There are genes that are associated with other cancers, such as colon, ovarian, uterine and others.  The chance of testing positive for these are low as there are no other cancers in the family. We discussed that testing is beneficial for several reasons including knowing how to follow individuals after completing their treatment, and understand if other family members could be at risk for cancer and allow them to undergo genetic testing.   We discussed that some people do not want to undergo genetic testing due to fear of genetic discrimination.  A federal law called the Genetic Information Non-Discrimination Act (GINA) of 2008 helps protect individuals against genetic discrimination based on their genetic test results.  It impacts both health insurance and employment.  With health insurance, it protects against increased premiums, being kicked off insurance or being forced to take a test in order to be insured.  For employment it protects against hiring, firing and promoting decisions based on genetic test results.  Health status due to a cancer diagnosis is not protected under GINA.   We reviewed the characteristics, features and inheritance patterns of hereditary cancer syndromes. We also discussed genetic testing, including the appropriate family members to test, the process of testing, insurance coverage and turn-around-time for results. We discussed the implications of a negative, positive, carrier status and/or variant of uncertain significant result. We  recommended [name] pursue genetic testing for the multi cancer gene panel. The Multi-Gene Panel offered by [facility] includes sequencing and/or deletion duplication testing of the following 85 genes: AIP, ALK, APC, ATM, AXIN2,BAP1,  BARD1, BLM, BMPR1A, BRCA1, BRCA2, BRIP1, CASR, CDC73, CDH1, CDK4, CDKN1B, CDKN1C, CDKN2A (p14ARF), CDKN2A (p16INK4a), CEBPA, CHEK2, CTNNA1, DICER1, DIS3L2, EGFR (c.2369C>T, p.Thr790Met variant only), EPCAM (Deletion/duplication testing only), FH, FLCN, GATA2, GPC3, GREM1 (Promoter region deletion/duplication testing only), HOXB13 (c.251G>A, p.Gly84Glu), HRAS, KIT, MAX, MEN1, MET, MITF (c.952G>A, p.Glu318Lys variant only), MLH1, MSH2, MSH3, MSH6, MUTYH, NBN, NF1, NF2, NTHL1, PALB2, PDGFRA, PHOX2B, PMS2, POLD1, POLE, POT1, PRKAR1A, PTCH1, PTEN, RAD50, RAD51C, RAD51D, RB1, RECQL4, RET, RNF43, RUNX1, SDHAF2, SDHA (sequence changes only), SDHB, SDHC, SDHD, SMAD4, SMARCA4, SMARCB1, SMARCE1, STK11, SUFU, TERC, TERT, TMEM127, TP53, TSC1, TSC2, VHL, WRN and WT1.   Based on Ms. Warrell's  family history of cancer, she meets medical criteria for genetic testing. Despite that she meets criteria, she may still have an out of pocket cost. We discussed that if her out of pocket cost for testing is over $100, the laboratory will call and confirm whether she wants to proceed with testing.  If the out of pocket cost of testing is less than $100 she will be billed by the genetic testing laboratory.   Based on the patient's family history, a statistical model (Tyrer Cusik) was used to estimate her risk of developing breast cancer. This estimates her lifetime risk of developing breast cancer to be approximately 18.4%. This estimation does not consider any genetic testing results.  The patient's lifetime breast cancer risk is a preliminary estimate based on available information using one of several models endorsed by the [place] (ACS). The ACS recommends consideration of breast MRI  screening as an adjunct to mammography for patients at high risk (defined as 20% or greater lifetime risk).      PLAN: After considering the risks, benefits, and limitations, Ms. Strathman provided informed consent to pursue genetic testing.  A saliva kit will be mailed to Ms. Vigna and she will submit the sample to [facility] for analysis of the multi cancer panel. Results should be available within approximately 2-3 weeks' time, at which point they will be disclosed by telephone to Ms. Scheunemann, as will any additional recommendations warranted by these results. Ms. Spader will receive a summary of her genetic counseling visit and a copy of her results once available. This information will also be available in Epic.   Lastly, we encouraged Ms. Boyack to remain in contact with cancer genetics annually so that we can continuously update the family history and inform her of any changes in cancer genetics and testing that may be of benefit for this family.   Ms. Debose questions were answered to her satisfaction today. Our contact information was provided should additional questions or concerns arise. Thank you for the referral and allowing [place] to share in the care of your patient.   Mayte Diers P. Florene Glen, [place], [facility] Licensed, Insurance risk surveyor [email or web address] phone: [phone]  The patient was seen for a total of 45 minutes in face-to-face genetic counseling.  This patient was discussed with Drs. Magrinat, Lindi Adie and/or Burr Medico who agrees with the above.    _______________________________________________________________________ For Office Staff:  Number of people involved in session: 1 Was an Intern/ student involved with case: no

## 2018-12-13 ENCOUNTER — Other Ambulatory Visit: Payer: Medicaid Other

## 2018-12-30 DIAGNOSIS — R5383 Other fatigue: Secondary | ICD-10-CM | POA: Diagnosis not present

## 2018-12-30 DIAGNOSIS — J302 Other seasonal allergic rhinitis: Secondary | ICD-10-CM | POA: Diagnosis not present

## 2019-01-03 DIAGNOSIS — D809 Immunodeficiency with predominantly antibody defects, unspecified: Secondary | ICD-10-CM | POA: Diagnosis not present

## 2019-01-03 DIAGNOSIS — J329 Chronic sinusitis, unspecified: Secondary | ICD-10-CM | POA: Diagnosis not present

## 2019-01-06 ENCOUNTER — Telehealth: Payer: Self-pay | Admitting: Genetic Counselor

## 2019-01-06 ENCOUNTER — Encounter: Payer: Self-pay | Admitting: Genetic Counselor

## 2019-01-06 DIAGNOSIS — Z1379 Encounter for other screening for genetic and chromosomal anomalies: Secondary | ICD-10-CM | POA: Insufficient documentation

## 2019-01-06 NOTE — Telephone Encounter (Signed)
LM on VM that results are back and to please call. 

## 2019-01-10 NOTE — Telephone Encounter (Signed)
LM on VM that results are back and to please CB.  Left CB number. 

## 2019-01-11 DIAGNOSIS — H16223 Keratoconjunctivitis sicca, not specified as Sjogren's, bilateral: Secondary | ICD-10-CM | POA: Diagnosis not present

## 2019-01-11 DIAGNOSIS — H40033 Anatomical narrow angle, bilateral: Secondary | ICD-10-CM | POA: Diagnosis not present

## 2019-01-12 ENCOUNTER — Ambulatory Visit: Payer: Self-pay | Admitting: Genetic Counselor

## 2019-01-12 DIAGNOSIS — Z1379 Encounter for other screening for genetic and chromosomal anomalies: Secondary | ICD-10-CM

## 2019-01-12 NOTE — Telephone Encounter (Signed)
Revealed negative genetic testing.  Discussed that we do not know why there is cancer in the family. It could be due to a different gene that we are not testing, or maybe our current technology may not be able to pick something up.  It will be important for her to keep in contact with genetics to keep up with whether additional testing may be needed.  

## 2019-01-12 NOTE — Progress Notes (Signed)
HPI:  Ms. Patricia Whitehead was previously seen in the [facility] clinic due to a family history of breast cancer and concerns regarding a hereditary predisposition to cancer. Please refer to our prior cancer genetics clinic note for more information regarding our discussion, assessment and recommendations, at the time. Ms. Patricia Whitehead recent genetic test results were disclosed to her, as were recommendations warranted by these results. These results and recommendations are discussed in more detail below.  CANCER HISTORY:  Oncology History   No history exists.    FAMILY HISTORY:  We obtained a detailed, 4-generation family history.  Significant diagnoses are listed below: Family History  Problem Relation Age of Onset   Diabetes Mother    Breast cancer Mother 71      The patient has one daughter who is cancer free. She has two sisters and a brother who are all cancer free.  Both parents are living.  The patient's mother had stage III breast cancer at 18.  She has five brothers who are cancer free.  Her father is deceased from old age and her mother is living.  The patient's father is living. He has three sisters and a brother.  They are all cancer free.  The paternal grandparents are living and cancer free.  Ms. Patricia Whitehead is unaware of previous family history of genetic testing for hereditary cancer risks. Patient's maternal ancestors are of [place] descent, and paternal ancestors are of [place] descent. There is no reported Ashkenazi Jewish ancestry. There is no known consanguinity.  GENETIC TEST RESULTS: Genetic testing reported out on January 03, 2019 through the multi-cancer panel found no pathogenic mutations. The Multi-Gene Panel offered by [facility] includes sequencing and/or deletion duplication testing of the following 85 genes: AIP, ALK, APC, ATM, AXIN2,BAP1,  BARD1, BLM, BMPR1A, BRCA1, BRCA2, BRIP1, CASR, CDC73, CDH1, CDK4, CDKN1B, CDKN1C, CDKN2A (p14ARF), CDKN2A (p16INK4a),  CEBPA, CHEK2, CTNNA1, DICER1, DIS3L2, EGFR (c.2369C>T, p.Thr790Met variant only), EPCAM (Deletion/duplication testing only), FH, FLCN, GATA2, GPC3, GREM1 (Promoter region deletion/duplication testing only), HOXB13 (c.251G>A, p.Gly84Glu), HRAS, KIT, MAX, MEN1, MET, MITF (c.952G>A, p.Glu318Lys variant only), MLH1, MSH2, MSH3, MSH6, MUTYH, NBN, NF1, NF2, NTHL1, PALB2, PDGFRA, PHOX2B, PMS2, POLD1, POLE, POT1, PRKAR1A, PTCH1, PTEN, RAD50, RAD51C, RAD51D, RB1, RECQL4, RET, RNF43, RUNX1, SDHAF2, SDHA (sequence changes only), SDHB, SDHC, SDHD, SMAD4, SMARCA4, SMARCB1, SMARCE1, STK11, SUFU, TERC, TERT, TMEM127, TP53, TSC1, TSC2, VHL, WRN and WT1.  The test report has been scanned into EPIC and is located under the Molecular Pathology section of the Results Review tab.  A portion of the result report is included below for reference.     We discussed with Ms. Patricia Whitehead that because current genetic testing is not perfect, it is possible there may be a gene mutation in one of these genes that current testing cannot detect, but that chance is small.  We also discussed, that there could be another gene that has not yet been discovered, or that we have not yet tested, that is responsible for the cancer diagnoses in the family. It is also possible there is a hereditary cause for the cancer in the family that Ms. Patricia Whitehead did not inherit and therefore was not identified in her testing.  Therefore, it is important to remain in touch with cancer genetics in the future so that we can continue to offer Ms. Patricia Whitehead the most up to date genetic testing.   ADDITIONAL GENETIC TESTING: We discussed with Ms. Patricia Whitehead that her genetic testing was fairly extensive.  If there are genes identified  to increase cancer risk that can be analyzed in the future, we would be happy to discuss and coordinate this testing at that time.    CANCER SCREENING RECOMMENDATIONS: Ms. Patricia Whitehead test result is considered negative (normal).  This means that we have not  identified a hereditary cause for her family history of breast cancer at this time. Most cancers happen by chance and this negative test suggests that her cancer may fall into this category.    While reassuring, this does not definitively rule out a hereditary predisposition to cancer. It is still possible that there could be genetic mutations that are undetectable by current technology. There could be genetic mutations in genes that have not been tested or identified to increase cancer risk.  Therefore, it is recommended she continue to follow the cancer management and screening guidelines provided by her primary healthcare provider.   An individual's cancer risk and medical management are not determined by genetic test results alone. Overall cancer risk assessment incorporates additional factors, including personal medical history, family history, and any available genetic information that may result in a personalized plan for cancer prevention and surveillance  RECOMMENDATIONS FOR FAMILY MEMBERS:  Individuals in this family might be at some increased risk of developing cancer, over the general population risk, simply due to the family history of cancer.  We recommended women in this family have a yearly mammogram beginning at age 36, or 83 years younger than the earliest onset of cancer, an annual clinical breast exam, and perform monthly breast self-exams. Women in this family should also have a gynecological exam as recommended by their primary provider. All family members should have a colonoscopy by age 70.  It is also possible there is a hereditary cause for the cancer in Ms. Patricia Whitehead's family that she did not inherit and therefore was not identified in her.  Based on Ms. Patricia Whitehead's family history, we recommended her mother, who was diagnosed with breast cancer at age 50, have genetic counseling and testing. Ms. Patricia Whitehead will let us know if we can be of any assistance in coordinating genetic counseling  and/or testing for this family member.   FOLLOW-UP: Lastly, we discussed with Ms. Patricia Whitehead that cancer genetics is a rapidly advancing field and it is possible that new genetic tests will be appropriate for her and/or her family members in the future. We encouraged her to remain in contact with cancer genetics on an annual basis so we can update her personal and family histories and let her know of advances in cancer genetics that may benefit this family.   Our contact number was provided. Ms. Patricia Whitehead questions were answered to her satisfaction, and she knows she is welcome to call us at anytime with additional questions or concerns.   Roma Kayser, [place], [facility] Licensed, Certified Genetic Counselor [email or web address]

## 2019-01-13 DIAGNOSIS — J329 Chronic sinusitis, unspecified: Secondary | ICD-10-CM | POA: Diagnosis not present

## 2019-01-13 DIAGNOSIS — J342 Deviated nasal septum: Secondary | ICD-10-CM | POA: Diagnosis not present

## 2019-01-20 DIAGNOSIS — H1045 Other chronic allergic conjunctivitis: Secondary | ICD-10-CM | POA: Diagnosis not present

## 2019-01-20 DIAGNOSIS — J3089 Other allergic rhinitis: Secondary | ICD-10-CM | POA: Diagnosis not present

## 2019-01-20 DIAGNOSIS — J3081 Allergic rhinitis due to animal (cat) (dog) hair and dander: Secondary | ICD-10-CM | POA: Diagnosis not present

## 2019-01-20 DIAGNOSIS — J301 Allergic rhinitis due to pollen: Secondary | ICD-10-CM | POA: Diagnosis not present

## 2019-02-02 DIAGNOSIS — J3489 Other specified disorders of nose and nasal sinuses: Secondary | ICD-10-CM | POA: Insufficient documentation

## 2019-02-02 DIAGNOSIS — H938X2 Other specified disorders of left ear: Secondary | ICD-10-CM | POA: Diagnosis not present

## 2019-02-02 DIAGNOSIS — J309 Allergic rhinitis, unspecified: Secondary | ICD-10-CM | POA: Insufficient documentation

## 2019-03-01 ENCOUNTER — Ambulatory Visit: Payer: Medicaid Other | Admitting: Podiatry

## 2019-03-09 ENCOUNTER — Encounter: Payer: Self-pay | Admitting: Podiatry

## 2019-03-09 ENCOUNTER — Ambulatory Visit (INDEPENDENT_AMBULATORY_CARE_PROVIDER_SITE_OTHER): Payer: Medicaid Other

## 2019-03-09 ENCOUNTER — Ambulatory Visit: Payer: Medicaid Other | Admitting: Podiatry

## 2019-03-09 ENCOUNTER — Other Ambulatory Visit: Payer: Self-pay

## 2019-03-09 VITALS — BP 134/78 | HR 78 | Resp 16

## 2019-03-09 DIAGNOSIS — M722 Plantar fascial fibromatosis: Secondary | ICD-10-CM

## 2019-03-09 MED ORDER — DICLOFENAC SODIUM 75 MG PO TBEC: 75.0000 mg | DELAYED_RELEASE_TABLET | Freq: Two times a day (BID) | ORAL | 2 refills | Status: DC

## 2019-03-09 NOTE — Patient Instructions (Signed)

## 2019-03-14 ENCOUNTER — Telehealth: Payer: Self-pay | Admitting: Podiatry

## 2019-03-14 NOTE — Telephone Encounter (Signed)
Left message for pt to call with more information concerning the pain in the left side.

## 2019-03-14 NOTE — Telephone Encounter (Signed)
Pt has called to say since being put in the boot shes having pain on left side same side as the foot shes wearing the boot wants to know to know if sahe needs to come in sooner

## 2019-03-14 NOTE — Progress Notes (Signed)
Subjective:   Patient ID: Patricia Whitehead, female   DOB: 33 y.o.   MRN: [id number]   HPI Patient presents stating has had a lot of pain in the left heel that is been going on for a long time and is tried different things in the past without relief and it is very tender in the medial band of the heel and has had previous injection.  States it feels like she just cannot bear weight on the heel and does not smoke likes to be active   Review of Systems  All other systems reviewed and are negative.       Objective:  Physical Exam Vitals signs and nursing note reviewed.  Constitutional:      Appearance: She is well-developed.  Pulmonary:     Effort: Pulmonary effort is normal.  Musculoskeletal: Normal range of motion.  Skin:    General: Skin is warm.  Neurological:     Mental Status: She is alert.     Neurovascular status intact muscle strength found to be adequate range of motion within normal limits with patient noted to have exquisite discomfort medial fascial band left at the insertional point of the tendon into the calcaneus with inflammation of the band and it appears to be involving the entire plantar heel and moderate on the lateral side of the foot where she is walking different     Assessment:  Acute plantar fasciitis left has not responded so far conservatively     Plan:  H&P reviewed condition and at this point recommended aggressive conservative treatment with the possibility for surgery in future.  Today I did a sterile prep injected the fascia 3 mg Kenalog 5 mg Xylocaine and applied air fracture walker to completely immobilize the foot and lower leg.  Gave instructions on ice therapy anti-inflammatories and reappoint to recheck  X-rays indicate spur formation with no indications of stress fracture arthritis

## 2019-04-03 DIAGNOSIS — J329 Chronic sinusitis, unspecified: Secondary | ICD-10-CM | POA: Diagnosis not present

## 2019-04-06 ENCOUNTER — Ambulatory Visit (INDEPENDENT_AMBULATORY_CARE_PROVIDER_SITE_OTHER): Payer: Medicaid Other | Admitting: Podiatry

## 2019-04-06 ENCOUNTER — Other Ambulatory Visit: Payer: Self-pay

## 2019-04-06 ENCOUNTER — Encounter: Payer: Self-pay | Admitting: Podiatry

## 2019-04-06 DIAGNOSIS — M722 Plantar fascial fibromatosis: Secondary | ICD-10-CM | POA: Diagnosis not present

## 2019-04-10 DIAGNOSIS — M545 Low back pain: Secondary | ICD-10-CM | POA: Diagnosis not present

## 2019-04-10 NOTE — Progress Notes (Signed)
Subjective:   Patient ID: Patricia Whitehead, female   DOB: 33 y.o.   MRN: [id number]   HPI Patient states she is improving quite a bit with the pain in the boot is helpful   ROS      Objective:  Physical Exam  Neurovascular status intact with inflammation pain of the plantar heel left that is improved but is still present     Assessment:  Plantar fasciitis left that has improved with conservative treatment H     Plan:  P reviewed condition and recommended physical therapy anti-inflammatory supportive shoes and continued boot usage with gradual reduction.  Reappoint if symptoms indicate

## 2019-04-14 DIAGNOSIS — J3081 Allergic rhinitis due to animal (cat) (dog) hair and dander: Secondary | ICD-10-CM | POA: Diagnosis not present

## 2019-04-14 DIAGNOSIS — J301 Allergic rhinitis due to pollen: Secondary | ICD-10-CM | POA: Diagnosis not present

## 2019-04-17 DIAGNOSIS — J3089 Other allergic rhinitis: Secondary | ICD-10-CM | POA: Diagnosis not present

## 2019-05-02 DIAGNOSIS — J309 Allergic rhinitis, unspecified: Secondary | ICD-10-CM | POA: Diagnosis not present

## 2019-05-02 DIAGNOSIS — J3489 Other specified disorders of nose and nasal sinuses: Secondary | ICD-10-CM | POA: Diagnosis not present

## 2019-05-12 DIAGNOSIS — M25571 Pain in right ankle and joints of right foot: Secondary | ICD-10-CM | POA: Diagnosis not present

## 2019-05-12 DIAGNOSIS — B9681 Helicobacter pylori [H. pylori] as the cause of diseases classified elsewhere: Secondary | ICD-10-CM | POA: Diagnosis not present

## 2019-05-22 DIAGNOSIS — S93402A Sprain of unspecified ligament of left ankle, initial encounter: Secondary | ICD-10-CM | POA: Diagnosis not present

## 2019-06-14 DIAGNOSIS — J3081 Allergic rhinitis due to animal (cat) (dog) hair and dander: Secondary | ICD-10-CM | POA: Diagnosis not present

## 2019-06-14 DIAGNOSIS — J301 Allergic rhinitis due to pollen: Secondary | ICD-10-CM | POA: Diagnosis not present

## 2019-06-14 DIAGNOSIS — J3089 Other allergic rhinitis: Secondary | ICD-10-CM | POA: Diagnosis not present

## 2019-06-21 DIAGNOSIS — J301 Allergic rhinitis due to pollen: Secondary | ICD-10-CM | POA: Diagnosis not present

## 2019-06-21 DIAGNOSIS — J3081 Allergic rhinitis due to animal (cat) (dog) hair and dander: Secondary | ICD-10-CM | POA: Diagnosis not present

## 2019-06-21 DIAGNOSIS — J3089 Other allergic rhinitis: Secondary | ICD-10-CM | POA: Diagnosis not present

## 2019-06-28 DIAGNOSIS — J301 Allergic rhinitis due to pollen: Secondary | ICD-10-CM | POA: Diagnosis not present

## 2019-06-28 DIAGNOSIS — J3081 Allergic rhinitis due to animal (cat) (dog) hair and dander: Secondary | ICD-10-CM | POA: Diagnosis not present

## 2019-06-28 DIAGNOSIS — J3089 Other allergic rhinitis: Secondary | ICD-10-CM | POA: Diagnosis not present

## 2019-07-05 DIAGNOSIS — J3081 Allergic rhinitis due to animal (cat) (dog) hair and dander: Secondary | ICD-10-CM | POA: Diagnosis not present

## 2019-07-05 DIAGNOSIS — J3089 Other allergic rhinitis: Secondary | ICD-10-CM | POA: Diagnosis not present

## 2019-07-05 DIAGNOSIS — J301 Allergic rhinitis due to pollen: Secondary | ICD-10-CM | POA: Diagnosis not present

## 2019-07-06 ENCOUNTER — Ambulatory Visit (INDEPENDENT_AMBULATORY_CARE_PROVIDER_SITE_OTHER): Payer: Medicaid Other | Admitting: Gastroenterology

## 2019-07-06 ENCOUNTER — Other Ambulatory Visit: Payer: Self-pay

## 2019-07-06 VITALS — Ht 61.0 in | Wt 129.0 lb

## 2019-07-06 DIAGNOSIS — R079 Chest pain, unspecified: Secondary | ICD-10-CM | POA: Diagnosis not present

## 2019-07-06 NOTE — Progress Notes (Signed)
This patient contacted our office requesting a physician telemedicine consultation regarding clinical questions and/or test results. Due to COVID restrictions, this was felt to be the most appropriate method of patient evaluation.  Participants on the conference : myself and patient   The patient consented to this consultation and was aware that a charge will be placed through their insurance.  They were also made aware of the limitations of telemedicine.  I was in my office and the patient was at home.   Encounter time:  Total time 25 minutes, with 18 minutes spent with patient on Doximity   _____________________________________________________________________________________________               Patricia [facility] Progress Note  Chief Complaint: Chest pain  Subjective  History: From my last (telemedicine) visit with this patient July 2010: "Seen by me July 2018 for a recent acute infectious illness that occurred after returning home from a cruise.  Her primary care provider reportedly diagnosed H. pylori by breath test and put her on Pylera.  My advice was to confirm H. pylori eradication with repeat breath test, as her symptoms had resolved. She was seen by our PA in February 2019 for prolonged upper abdominal pain and diarrhea after a probable infectious illness contracted from her daughter.  This was felt likely due to underlying IBS (constipation-predominant, Dx by Dr. Deatra Ina in 2010 - EGD done then).  At that time, she reported that the repeat H. pylori testing by primary care had been negative.   Kimimila tells me her PCP wanted her to have a reevaluation out of concern that she seems susceptible to GI infections when her children get them, and that she seems to have prolonged symptoms for weeks afterwards.  This has apparently happened a couple more times since her last evaluation by [place] about 18 months ago.  She has been feeling well for many months now, and says her bowel habits  became more regular with a healthier diet and regular use of MiraLAX.  She has not needed the Linzess for well over a year now.  She denies nausea vomiting early satiety weight loss or rectal bleeding. Lastly, for about the last 2 years she will have heartburn requiring almost daily use of omeprazole or Tums.  She denies dysphagia or odynophagia." I ordered an IgA level to rule out IgA deficiency, which was normal.  IgA TTG also normal. ______________________________________  She has been bothered for over a year by symptoms that she refers to as GERD.  It was somewhat difficult to characterize, but sounds like a chest pressure and shortness of breath that occurs may be 30 to 45 minutes after eating, and she feels as if she could just belch she would get relief.  She does not get pyrosis or regurgitation, that was never symptom even prior to starting PPI therapy.  She began on 20 mg a day of omeprazole, then escalated to 40 mg daily, now 40 mg twice daily without substantial improvement.  She is also gone on a bland diet, and feels like the increase of carbs has made her gain about 10 pounds over the last year. She denies vomiting or odynophagia, occasional nausea treated with Zofran prescribed by primary care  Recalls an EGD and colonoscopy perhaps 12 or 13 years ago because of abdominal pain and constipation.  That is when she recalls a diagnosis of IBS.  Regular MiraLAX dosing seems to relieve that.  ROS: Cardiovascular:  no chest pain Respiratory: no dyspnea Allergic  sinusitis and congestion with postnasal drip Remainder of systems negative except as above  The patient's Past Medical, Family and Social History were reviewed and are on file in the EMR.  Objective:  Med list reviewed  Current Outpatient Medications:    albuterol (PROAIR HFA) 108 (90 Base) MCG/ACT inhaler, ProAir HFA 90 mcg/actuation aerosol inhaler  2 PUFFS AND HELD EVERY 6 TO 8 HOURS AS NEEDED FOR COUGH, WHEEZE, OR  SHORTNESS OF BREATH, Disp: , Rfl:    cetirizine (ZYRTEC) 10 MG tablet, cetirizine 10 mg tablet  Take 1 tablet every day by oral route for 30 days., Disp: , Rfl:    fluticasone (FLONASE) 50 MCG/ACT nasal spray, Place 2 sprays into both nostrils daily., Disp: , Rfl:    levocetirizine (XYZAL) 5 MG tablet, Take 5 mg by mouth at bedtime., Disp: , Rfl:    losartan (COZAAR) 25 MG tablet, Take 25 mg by mouth daily., Disp: , Rfl:    NON FORMULARY, Allergy shots weekly, Disp: , Rfl:    norgestimate-ethinyl estradiol (SPRINTEC 28) 0.25-35 MG-MCG tablet, Take 1 tablet by mouth daily., Disp: , Rfl:    omeprazole (PRILOSEC) 40 MG capsule, Take 40 mg by mouth daily., Disp: , Rfl:    ondansetron (ZOFRAN-ODT) 4 MG disintegrating tablet, SMARTSIG:1 Milligram(s) Sublingual Every 6 Hours PRN, Disp: , Rfl:    No exam-virtual visit  Labs:   ___________________________________________ Radiologic studies:   ____________________________________________ Other:   _____________________________________________ Assessment & Plan  Assessment: Encounter Diagnosis  Name Primary?   Chest pain in adult Yes   It is not clear the symptoms are really GERD, with atypical symptoms and little response to high-dose PPI.  Query functional upper digestive disorder.  Plan: EGD.  She is agreeable after discussion of procedure and risks.    The benefits and risks of the planned procedure were described in detail with the patient or (when appropriate) their health care proxy.  Risks were outlined as including, but not limited to, bleeding, infection, perforation, adverse medication reaction leading to cardiac or pulmonary decompensation, pancreatitis (if ERCP).  The limitation of incomplete mucosal visualization was also discussed.  No guarantees or warranties were given.  Our office staff will contact her to make the arrangements.  Charlie Pitter III

## 2019-07-07 ENCOUNTER — Telehealth: Payer: Self-pay

## 2019-07-07 NOTE — Telephone Encounter (Signed)
Sherrilyn Rist, MD  Alexis Frock, CMA  Seen in telemedicine today. 07-06-2019  Needs to be scheduled for EGD in the [facility].   - HD   Left a message to return call.

## 2019-07-12 DIAGNOSIS — J3081 Allergic rhinitis due to animal (cat) (dog) hair and dander: Secondary | ICD-10-CM | POA: Diagnosis not present

## 2019-07-12 DIAGNOSIS — J301 Allergic rhinitis due to pollen: Secondary | ICD-10-CM | POA: Diagnosis not present

## 2019-07-12 DIAGNOSIS — J3089 Other allergic rhinitis: Secondary | ICD-10-CM | POA: Diagnosis not present

## 2019-07-13 DIAGNOSIS — H93291 Other abnormal auditory perceptions, right ear: Secondary | ICD-10-CM | POA: Diagnosis not present

## 2019-07-13 DIAGNOSIS — M2669 Other specified disorders of temporomandibular joint: Secondary | ICD-10-CM | POA: Insufficient documentation

## 2019-07-13 DIAGNOSIS — H9201 Otalgia, right ear: Secondary | ICD-10-CM | POA: Insufficient documentation

## 2019-07-19 DIAGNOSIS — J3089 Other allergic rhinitis: Secondary | ICD-10-CM | POA: Diagnosis not present

## 2019-07-19 DIAGNOSIS — J3081 Allergic rhinitis due to animal (cat) (dog) hair and dander: Secondary | ICD-10-CM | POA: Diagnosis not present

## 2019-07-19 DIAGNOSIS — J301 Allergic rhinitis due to pollen: Secondary | ICD-10-CM | POA: Diagnosis not present

## 2019-07-26 DIAGNOSIS — J301 Allergic rhinitis due to pollen: Secondary | ICD-10-CM | POA: Diagnosis not present

## 2019-07-26 DIAGNOSIS — J3089 Other allergic rhinitis: Secondary | ICD-10-CM | POA: Diagnosis not present

## 2019-07-26 DIAGNOSIS — J3081 Allergic rhinitis due to animal (cat) (dog) hair and dander: Secondary | ICD-10-CM | POA: Diagnosis not present

## 2019-07-27 ENCOUNTER — Encounter: Payer: Self-pay | Admitting: Gastroenterology

## 2019-07-31 NOTE — Telephone Encounter (Signed)
Patient is scheduled for EGD 08/31/19 and for previsit as well.

## 2019-08-04 ENCOUNTER — Other Ambulatory Visit: Payer: Self-pay | Admitting: Podiatry

## 2019-08-04 ENCOUNTER — Ambulatory Visit: Payer: Medicaid Other | Admitting: Podiatry

## 2019-08-04 ENCOUNTER — Encounter: Payer: Self-pay | Admitting: Podiatry

## 2019-08-04 ENCOUNTER — Telehealth: Payer: Self-pay | Admitting: Podiatry

## 2019-08-04 ENCOUNTER — Other Ambulatory Visit: Payer: Self-pay

## 2019-08-04 ENCOUNTER — Ambulatory Visit (INDEPENDENT_AMBULATORY_CARE_PROVIDER_SITE_OTHER): Payer: Medicaid Other

## 2019-08-04 VITALS — Temp 97.8°F

## 2019-08-04 DIAGNOSIS — M722 Plantar fascial fibromatosis: Secondary | ICD-10-CM

## 2019-08-04 DIAGNOSIS — J3081 Allergic rhinitis due to animal (cat) (dog) hair and dander: Secondary | ICD-10-CM | POA: Diagnosis not present

## 2019-08-04 DIAGNOSIS — J301 Allergic rhinitis due to pollen: Secondary | ICD-10-CM | POA: Diagnosis not present

## 2019-08-04 DIAGNOSIS — M79671 Pain in right foot: Secondary | ICD-10-CM

## 2019-08-04 DIAGNOSIS — J3089 Other allergic rhinitis: Secondary | ICD-10-CM | POA: Diagnosis not present

## 2019-08-04 MED ORDER — MELOXICAM 15 MG PO TABS: 15.0000 mg | ORAL_TABLET | Freq: Every day | ORAL | 0 refills | Status: DC

## 2019-08-04 NOTE — Telephone Encounter (Signed)
Pt called stating she is still having pain after her appt 08/04/19 w/Dr.Price and she would like something for pain she wants the nurse to call her back

## 2019-08-04 NOTE — Progress Notes (Signed)
  Subjective:  Patient ID: Patricia Whitehead, female    DOB: 06/04/1985,  MRN: [id number]  Chief Complaint  Patient presents with  . Foot Pain    BL "think its my plantar fasciitis; never stopped hurting; given a boot 03/09/2019 and havn't been able to walk without it; feet just throbs constantly now"    34 y.o. female presents with the above complaint. History confirmed with patient.   Objective:  Physical Exam: warm, good capillary refill, no trophic changes or ulcerative lesions, normal DP and PT pulses and normal sensory exam. Left Foot: tenderness to palpation medial calcaneal tuber, no pain with calcaneal squeeze, decreased ankle joint ROM and +Silverskiold test Right Foot: tenderness to palpation medial calcaneal tuber, no pain with calcaneal squeeze, decreased ankle joint ROM and +Silverskiold test  Radiographs: X-ray of both feet: no fracture, dislocation, swelling or degenerative changes noted and no evidence of calcaneal stress fracture  Assessment:   1. Plantar fasciitis     Plan:  Patient was evaluated and treated and all questions answered.  Plantar Fasciitis -Rx for meloxicam. Educated on use, risks and benefits of the medication -Discussed trial of getting out of the boot.   Procedure: Injection Tendon/Ligament Consent: Verbal consent obtained. Location: Bilateral plantar fascia at the glabrous junction; medial approach. Skin Prep: Alcohol. Injectate: 1 cc 0.5% marcaine plain, 1 cc dexamethasone phosphate, 0.5 cc kenalog 10. Disposition: Patient tolerated procedure well. Injection site dressed with a band-aid.  Return in about 4 weeks (around 09/01/2019) for Plantar fasciitis.

## 2019-08-04 NOTE — Telephone Encounter (Signed)
I spoke with pt and she started screaming that a podiatrist should know how to give a shot she has shots before from another podiatrist and now she is in more pain than she ever was. I asked pt if she had her antiinflammatory pain medication that she should start. Pt states meloxicam doesn't help. I tried to tell her that some times with a steroid injection you may get a flare of symptoms and to ice. Then pt started screaming over me and I asked her not to scream. Pt states she waited 3 weeks for the appt and she is worse now. I offered to ask Dr. Samuella Cota for pain medication and she state no, she wanted me to ask Dr. Charlsie Merles and I told her he would not be in office until Monday and she said she would take the meloxicam and wait for Dr. Charlsie Merles.

## 2019-08-07 NOTE — Telephone Encounter (Signed)
I did not see this patient last visit. Not sure her status

## 2019-08-08 NOTE — Telephone Encounter (Signed)
Valery:  Dr. Samuella Cota saw this patient on 08/04/19 not Dr. Charlsie Merles.  I did let Clarita Crane know this on Monday. Keely left two messages for the patient to call her back. As of right now, patient has not called Keely back.  Thank you, Hannah Beat, CMA (AAMA)

## 2019-08-09 DIAGNOSIS — J301 Allergic rhinitis due to pollen: Secondary | ICD-10-CM | POA: Diagnosis not present

## 2019-08-09 DIAGNOSIS — J3089 Other allergic rhinitis: Secondary | ICD-10-CM | POA: Diagnosis not present

## 2019-08-09 DIAGNOSIS — J3081 Allergic rhinitis due to animal (cat) (dog) hair and dander: Secondary | ICD-10-CM | POA: Diagnosis not present

## 2019-08-17 ENCOUNTER — Telehealth: Payer: Self-pay | Admitting: *Deleted

## 2019-08-17 ENCOUNTER — Other Ambulatory Visit: Payer: Self-pay

## 2019-08-17 ENCOUNTER — Ambulatory Visit (AMBULATORY_SURGERY_CENTER): Payer: Self-pay | Admitting: *Deleted

## 2019-08-17 VITALS — Temp 98.0°F | Ht 61.0 in | Wt 130.0 lb

## 2019-08-17 DIAGNOSIS — R079 Chest pain, unspecified: Secondary | ICD-10-CM

## 2019-08-17 NOTE — Telephone Encounter (Signed)
Patient is having EGD  08/31/2019.  She is having worsening nausea and daily vomiting.  She was requesting re-fill for Zofran as it had helped significantly.  May I re-fill ? Thanks!

## 2019-08-17 NOTE — Progress Notes (Signed)

## 2019-08-18 MED ORDER — ONDANSETRON HCL 4 MG PO TABS: 4.0000 mg | ORAL_TABLET | Freq: Four times a day (QID) | ORAL | 1 refills | Status: DC | PRN

## 2019-08-18 NOTE — Telephone Encounter (Signed)
I sent the refill, thanks

## 2019-08-18 NOTE — Telephone Encounter (Signed)
Message left for patient that Zofran had been refilled and to contact her pharmacy.

## 2019-08-21 ENCOUNTER — Other Ambulatory Visit: Payer: Self-pay

## 2019-08-21 ENCOUNTER — Ambulatory Visit: Payer: Medicaid Other | Admitting: Podiatry

## 2019-08-21 ENCOUNTER — Encounter: Payer: Self-pay | Admitting: Podiatry

## 2019-08-21 DIAGNOSIS — M722 Plantar fascial fibromatosis: Secondary | ICD-10-CM | POA: Diagnosis not present

## 2019-08-21 NOTE — Progress Notes (Signed)
Subjective:   Patient ID: Patricia Whitehead, female   DOB: 34 y.o.   MRN: [id number]   HPI Patient states I am not getting as much pain in my heels as I am in my arch of both feet and I want to see if you can take care of it   ROS      Objective:  Physical Exam  Neurovascular status intact with discomfort in the arch region bilateral inflammation fluid buildup heels doing okay with mild discomfort still noted     Assessment:  Mid band plantar fasciitis bilateral sore     Plan:  Sterile prep and injected the mid band fascia bilateral 3 mg Kenalog 5 mg liken advised on supportive shoes stretching exercises reappoint as needed

## 2019-08-22 DIAGNOSIS — Z79899 Other long term (current) drug therapy: Secondary | ICD-10-CM | POA: Diagnosis not present

## 2019-08-22 DIAGNOSIS — F331 Major depressive disorder, recurrent, moderate: Secondary | ICD-10-CM | POA: Diagnosis not present

## 2019-08-23 ENCOUNTER — Other Ambulatory Visit: Payer: Self-pay

## 2019-08-23 ENCOUNTER — Telehealth: Payer: Self-pay | Admitting: Gastroenterology

## 2019-08-23 MED ORDER — ONDANSETRON 4 MG PO TBDP: 4.0000 mg | ORAL_TABLET | Freq: Four times a day (QID) | ORAL | 1 refills | Status: DC | PRN

## 2019-08-23 NOTE — Telephone Encounter (Signed)
Refilled as requested. Previous Rx has been discontinued.

## 2019-08-24 DIAGNOSIS — J3081 Allergic rhinitis due to animal (cat) (dog) hair and dander: Secondary | ICD-10-CM | POA: Diagnosis not present

## 2019-08-24 DIAGNOSIS — J301 Allergic rhinitis due to pollen: Secondary | ICD-10-CM | POA: Diagnosis not present

## 2019-08-24 DIAGNOSIS — J3089 Other allergic rhinitis: Secondary | ICD-10-CM | POA: Diagnosis not present

## 2019-08-25 DIAGNOSIS — J3081 Allergic rhinitis due to animal (cat) (dog) hair and dander: Secondary | ICD-10-CM | POA: Diagnosis not present

## 2019-08-25 DIAGNOSIS — J3089 Other allergic rhinitis: Secondary | ICD-10-CM | POA: Diagnosis not present

## 2019-08-25 DIAGNOSIS — J301 Allergic rhinitis due to pollen: Secondary | ICD-10-CM | POA: Diagnosis not present

## 2019-08-25 DIAGNOSIS — B999 Unspecified infectious disease: Secondary | ICD-10-CM | POA: Diagnosis not present

## 2019-08-31 ENCOUNTER — Ambulatory Visit (AMBULATORY_SURGERY_CENTER): Payer: Medicaid Other | Admitting: Gastroenterology

## 2019-08-31 ENCOUNTER — Encounter: Payer: Self-pay | Admitting: Gastroenterology

## 2019-08-31 ENCOUNTER — Other Ambulatory Visit: Payer: Self-pay

## 2019-08-31 VITALS — BP 123/81 | HR 90 | Temp 97.5°F | Resp 21 | Ht 61.0 in | Wt 130.0 lb

## 2019-08-31 DIAGNOSIS — K219 Gastro-esophageal reflux disease without esophagitis: Secondary | ICD-10-CM | POA: Diagnosis not present

## 2019-08-31 DIAGNOSIS — R079 Chest pain, unspecified: Secondary | ICD-10-CM | POA: Diagnosis not present

## 2019-08-31 DIAGNOSIS — R110 Nausea: Secondary | ICD-10-CM | POA: Diagnosis not present

## 2019-08-31 DIAGNOSIS — I10 Essential (primary) hypertension: Secondary | ICD-10-CM | POA: Diagnosis not present

## 2019-08-31 DIAGNOSIS — R112 Nausea with vomiting, unspecified: Secondary | ICD-10-CM | POA: Diagnosis not present

## 2019-08-31 MED ORDER — SODIUM CHLORIDE 0.9 % IV SOLN: 500.0000 mL | Freq: Once | INTRAVENOUS | Status: AC

## 2019-08-31 NOTE — Progress Notes (Signed)
Temp-JB VS-CW  Pt's states no medical or surgical changes since previsit or office visit.  

## 2019-08-31 NOTE — Patient Instructions (Signed)
YOU HAD AN ENDOSCOPIC PROCEDURE TODAY AT THE [facility]:   Refer to the procedure report that was given to you for any specific questions about what was found during the examination.  If the procedure report does not answer your questions, please call your gastroenterologist to clarify.  If you requested that your care partner not be given the details of your procedure findings, then the procedure report has been included in a sealed envelope for you to review at your convenience later.  YOU SHOULD EXPECT: Some feelings of bloating in the abdomen. Passage of more gas than usual.  Walking can help get rid of the air that was put into your GI tract during the procedure and reduce the bloating. If you had a lower endoscopy (such as a colonoscopy or flexible sigmoidoscopy) you may notice spotting of blood in your stool or on the toilet paper. If you underwent a bowel prep for your procedure, you may not have a normal bowel movement for a few days.  Please Note:  You might notice some irritation and congestion in your nose or some drainage.  This is from the oxygen used during your procedure.  There is no need for concern and it should clear up in a day or so.  SYMPTOMS TO REPORT IMMEDIATELY:  Following upper endoscopy (EGD)  Vomiting of blood or coffee ground material  New chest pain or pain under the shoulder blades  Painful or persistently difficult swallowing  New shortness of breath  Fever of 100F or higher  Black, tarry-looking stools  For urgent or emergent issues, a gastroenterologist can be reached at any hour by calling [phone]. Do not use MyChart messaging for urgent concerns.    DIET:  We do recommend a small meal at first, but then you may proceed to your regular diet.  Drink plenty of fluids but you should avoid alcoholic beverages for 24 hours.  ACTIVITY:  You should plan to take it easy for the rest of today and you should NOT DRIVE or use heavy machinery until  tomorrow (because of the sedation medicines used during the test).    FOLLOW UP: Our staff will call the number listed on your records 48-72 hours following your procedure to check on you and address any questions or concerns that you may have regarding the information given to you following your procedure. If we do not reach you, we will leave a message.  We will attempt to reach you two times.  During this call, we will ask if you have developed any symptoms of COVID 19. If you develop any symptoms (ie: fever, flu-like symptoms, shortness of breath, cough etc.) before then, please call [phone].  If you test positive for Covid 19 in the 2 weeks post procedure, please call and report this information to us.    SIGNATURES/CONFIDENTIALITY: You and/or your care partner have signed paperwork which will be entered into your electronic medical record.  These signatures attest to the fact that that the information above on your After Visit Summary has been reviewed and is understood.  Full responsibility of the confidentiality of this discharge information lies with you and/or your care-partner.  

## 2019-08-31 NOTE — Progress Notes (Signed)
Report given to PACU, vss 

## 2019-08-31 NOTE — Op Note (Signed)
[facility] Patient Name: Patricia Whitehead Procedure Date: 08/31/2019 10:33 AM MRN: [id number] Endoscopist: Sherilyn Cooter L. Myrtie Neither , MD Age: 34 Referring MD:  Date of Birth: Sep 18, 1985 Gender: Female Account #: [id number] Procedure:                Upper GI endoscopy Indications:              Unexplained chest pain (see recent office note);                            unresponsive to high dose PPI which has since been                            discontinued, Nausea with intermittent vomiting -                            chronic NSAID use, symptoms worse with stress Medicines:                Monitored Anesthesia Care Procedure:                Pre-Anesthesia Assessment:                           - Prior to the procedure, a History and Physical                            was performed, and patient medications and                            allergies were reviewed. The patient's tolerance of                            previous anesthesia was also reviewed. The risks                            and benefits of the procedure and the sedation                            options and risks were discussed with the patient.                            All questions were answered, and informed consent                            was obtained. Prior Anticoagulants: The patient has                            taken no previous anticoagulant or antiplatelet                            agents. ASA Grade Assessment: II - A patient with                            mild systemic disease. After reviewing the risks  and benefits, the patient was deemed in                            satisfactory condition to undergo the procedure.                           After obtaining informed consent, the endoscope was                            passed under direct vision. Throughout the                            procedure, the patient's blood pressure, pulse, and                            oxygen  saturations were monitored continuously. The                            Endoscope was introduced through the mouth, and                            advanced to the second part of duodenum. The upper                            GI endoscopy was accomplished without difficulty.                            The patient tolerated the procedure well. Scope In: Scope Out: Findings:                 The esophagus was normal.                           The stomach was normal.                           The cardia and gastric fundus were normal on                            retroflexion.                           The examined duodenum was normal. Complications:            No immediate complications. Estimated Blood Loss:     Estimated blood loss: none. Impression:               - Normal esophagus.                           - Normal stomach.                           - Normal examined duodenum.                           - No specimens collected.  Functional upper digestive condition in patient                            with IBS, possibly exacerbated by chronic NSAID use                            for foot pain (under podiatric care) Recommendation:           - Patient has a contact number available for                            emergencies. The signs and symptoms of potential                            delayed complications were discussed with the                            patient. Return to normal activities tomorrow.                            Written discharge instructions were provided to the                            patient.                           - Resume previous diet.                           - Continue present medications, including                            ondansetron as needed. [name], MD 08/31/2019 11:01:34 AM This report has been signed electronically.

## 2019-09-04 ENCOUNTER — Telehealth: Payer: Self-pay | Admitting: *Deleted

## 2019-09-04 ENCOUNTER — Telehealth: Payer: Self-pay

## 2019-09-04 NOTE — Telephone Encounter (Signed)
Left message on follow up call. 

## 2019-09-04 NOTE — Telephone Encounter (Signed)
Attempted f/u phone call. No answer. Left message. °

## 2019-09-06 ENCOUNTER — Ambulatory Visit: Payer: Medicaid Other | Admitting: Podiatry

## 2019-09-13 DIAGNOSIS — J301 Allergic rhinitis due to pollen: Secondary | ICD-10-CM | POA: Diagnosis not present

## 2019-09-13 DIAGNOSIS — J3081 Allergic rhinitis due to animal (cat) (dog) hair and dander: Secondary | ICD-10-CM | POA: Diagnosis not present

## 2019-09-13 DIAGNOSIS — J3089 Other allergic rhinitis: Secondary | ICD-10-CM | POA: Diagnosis not present

## 2019-09-18 ENCOUNTER — Ambulatory Visit (INDEPENDENT_AMBULATORY_CARE_PROVIDER_SITE_OTHER): Payer: Medicaid Other | Admitting: Podiatry

## 2019-09-18 ENCOUNTER — Other Ambulatory Visit: Payer: Self-pay

## 2019-09-18 ENCOUNTER — Encounter: Payer: Self-pay | Admitting: Podiatry

## 2019-09-18 VITALS — Temp 98.2°F

## 2019-09-18 DIAGNOSIS — M722 Plantar fascial fibromatosis: Secondary | ICD-10-CM

## 2019-09-20 NOTE — Progress Notes (Signed)
Subjective:   Patient ID: Patricia Whitehead, female   DOB: 34 y.o.   MRN: [id number]   HPI Patient presents stating that I started develop pain in my arch again and it is worse in my left with the right when feeling better.  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  Discussed continued anti-inflammatories physical therapy and patient will be seen back to recheck

## 2019-09-22 DIAGNOSIS — J3089 Other allergic rhinitis: Secondary | ICD-10-CM | POA: Diagnosis not present

## 2019-09-22 DIAGNOSIS — J3081 Allergic rhinitis due to animal (cat) (dog) hair and dander: Secondary | ICD-10-CM | POA: Diagnosis not present

## 2019-09-22 DIAGNOSIS — J301 Allergic rhinitis due to pollen: Secondary | ICD-10-CM | POA: Diagnosis not present

## 2019-09-27 DIAGNOSIS — S93402D Sprain of unspecified ligament of left ankle, subsequent encounter: Secondary | ICD-10-CM | POA: Diagnosis not present

## 2019-09-29 DIAGNOSIS — J301 Allergic rhinitis due to pollen: Secondary | ICD-10-CM | POA: Diagnosis not present

## 2019-09-29 DIAGNOSIS — J3081 Allergic rhinitis due to animal (cat) (dog) hair and dander: Secondary | ICD-10-CM | POA: Diagnosis not present

## 2019-09-29 DIAGNOSIS — J3089 Other allergic rhinitis: Secondary | ICD-10-CM | POA: Diagnosis not present

## 2019-10-06 DIAGNOSIS — J3089 Other allergic rhinitis: Secondary | ICD-10-CM | POA: Diagnosis not present

## 2019-10-06 DIAGNOSIS — J3081 Allergic rhinitis due to animal (cat) (dog) hair and dander: Secondary | ICD-10-CM | POA: Diagnosis not present

## 2019-10-06 DIAGNOSIS — J301 Allergic rhinitis due to pollen: Secondary | ICD-10-CM | POA: Diagnosis not present

## 2019-10-10 ENCOUNTER — Other Ambulatory Visit: Payer: Medicaid Other | Admitting: Orthotics

## 2019-10-18 DIAGNOSIS — J3081 Allergic rhinitis due to animal (cat) (dog) hair and dander: Secondary | ICD-10-CM | POA: Diagnosis not present

## 2019-10-18 DIAGNOSIS — J3089 Other allergic rhinitis: Secondary | ICD-10-CM | POA: Diagnosis not present

## 2019-10-18 DIAGNOSIS — J301 Allergic rhinitis due to pollen: Secondary | ICD-10-CM | POA: Diagnosis not present

## 2019-10-19 ENCOUNTER — Other Ambulatory Visit: Payer: Medicaid Other | Admitting: Orthotics

## 2019-10-25 DIAGNOSIS — J3081 Allergic rhinitis due to animal (cat) (dog) hair and dander: Secondary | ICD-10-CM | POA: Diagnosis not present

## 2019-10-25 DIAGNOSIS — J3089 Other allergic rhinitis: Secondary | ICD-10-CM | POA: Diagnosis not present

## 2019-10-25 DIAGNOSIS — J301 Allergic rhinitis due to pollen: Secondary | ICD-10-CM | POA: Diagnosis not present

## 2019-10-31 ENCOUNTER — Ambulatory Visit: Payer: Medicaid Other | Admitting: Physical Therapy

## 2019-11-01 DIAGNOSIS — J301 Allergic rhinitis due to pollen: Secondary | ICD-10-CM | POA: Diagnosis not present

## 2019-11-01 DIAGNOSIS — J3081 Allergic rhinitis due to animal (cat) (dog) hair and dander: Secondary | ICD-10-CM | POA: Diagnosis not present

## 2019-11-01 DIAGNOSIS — J3089 Other allergic rhinitis: Secondary | ICD-10-CM | POA: Diagnosis not present

## 2019-11-06 ENCOUNTER — Other Ambulatory Visit: Payer: Self-pay

## 2019-11-06 ENCOUNTER — Ambulatory Visit (INDEPENDENT_AMBULATORY_CARE_PROVIDER_SITE_OTHER): Payer: Medicaid Other | Admitting: Orthotics

## 2019-11-06 DIAGNOSIS — M722 Plantar fascial fibromatosis: Secondary | ICD-10-CM

## 2019-11-06 NOTE — Progress Notes (Signed)
Patient came in today to pick up custom made foot orthotics.  The goals were accomplished and the patient reported no dissatisfaction with said orthotics.  Patient was advised of breakin period and how to report any issues. 

## 2019-11-08 DIAGNOSIS — J3081 Allergic rhinitis due to animal (cat) (dog) hair and dander: Secondary | ICD-10-CM | POA: Diagnosis not present

## 2019-11-08 DIAGNOSIS — J301 Allergic rhinitis due to pollen: Secondary | ICD-10-CM | POA: Diagnosis not present

## 2019-11-08 DIAGNOSIS — J3089 Other allergic rhinitis: Secondary | ICD-10-CM | POA: Diagnosis not present

## 2019-11-14 ENCOUNTER — Ambulatory Visit: Payer: Medicaid Other | Attending: Orthopaedic Surgery | Admitting: Physical Therapy

## 2019-11-14 ENCOUNTER — Other Ambulatory Visit: Payer: Self-pay

## 2019-11-14 ENCOUNTER — Encounter: Payer: Self-pay | Admitting: Physical Therapy

## 2019-11-14 DIAGNOSIS — M6281 Muscle weakness (generalized): Secondary | ICD-10-CM | POA: Diagnosis present

## 2019-11-14 DIAGNOSIS — M79671 Pain in right foot: Secondary | ICD-10-CM | POA: Insufficient documentation

## 2019-11-14 DIAGNOSIS — M25572 Pain in left ankle and joints of left foot: Secondary | ICD-10-CM | POA: Diagnosis not present

## 2019-11-14 DIAGNOSIS — M79672 Pain in left foot: Secondary | ICD-10-CM | POA: Diagnosis present

## 2019-11-14 NOTE — Therapy (Signed)
[facility] [address] [place], [place], [address] Phone: [phone]   Fax:  [phone]  Physical Therapy Evaluation  Patient Details  Name: Patricia Whitehead MRN: [id number] Date of Birth: January 29, 1986 Referring Provider (PT): Dub Mikes   Encounter Date: 11/14/2019   PT End of Session - 11/14/19 1718    Visit Number 1    Date for PT Re-Evaluation 01/15/20    PT Start Time 1538    PT Stop Time 1615    PT Time Calculation (min) 37 min    Activity Tolerance Patient tolerated treatment well    Behavior During Therapy [facility] for tasks assessed/performed           Past Medical History:  Diagnosis Date  . Allergy   . Anemia   . Anxiety   . Depression   . Family history of breast cancer   . GERD (gastroesophageal reflux disease)   . Hypertension   . IBS (irritable bowel syndrome)   . Menorrhagia with irregular cycle   . PTSD (post-traumatic stress disorder)     Past Surgical History:  Procedure Laterality Date  . NO PAST SURGERIES      There were no vitals filed for this visit.    Subjective Assessment - 11/14/19 1542    Subjective Pt reports B foot pain L>R beginning 9 years ago and worsening over the past year or so. Pt states that she has had custom orthotics made and those have helped a lot. Pt states some N/T in feet ocassionally. Pt reports she is having trouble working out because she can't do planks/push ups or any other exercise that puts pressure on her feet. Pt states that she is having balance problems; states that she has had more problems since spraining ankle falling down stairs in February.    Pertinent History L ankle sprain 05/2019    Limitations Standing;Walking    How long can you walk comfortably? 15 minutes    Patient Stated Goals get rid of pain, workout again, walk longer distances    Currently in Pain? Yes    Pain Score 6     Pain Location Foot    Pain Orientation Left    Pain  Descriptors / Indicators Constant;Aching;Throbbing    Pain Type Chronic pain    Pain Onset More than a month ago    Pain Frequency Constant    Aggravating Factors  walking, weight bearing    Pain Relieving Factors rest, stretching, ice              [facility] PT Assessment - 11/14/19 0001      Assessment   Medical Diagnosis B plantar fascitis    Referring Provider (PT) Dub Mikes    Prior Therapy None      Precautions   Precautions None      Restrictions   Weight Bearing Restrictions No      Balance Screen   Has the patient fallen in the past 6 months Yes    How many times? 1    Has the patient had a decrease in activity level because of a fear of falling?  No    Is the patient reluctant to leave their home because of a fear of falling?  No      Home Environment   Additional Comments lives on 2nd floor      Prior Function   Level of Independence Independent    Vocation Full time employment  Vocation Requirements works from [employer]      Functional Tests   Functional tests Squat;Single leg stance      Squat   Comments pain in B feet with increased depth      Single Leg Stance   Comments instability B L>R      ROM / Strength   AROM / PROM / Strength AROM;PROM;Strength      AROM   AROM Assessment Site Ankle    Right/Left Ankle Right;Left    Right Ankle Dorsiflexion -10    Left Ankle Dorsiflexion -10      PROM   PROM Assessment Site Ankle    Right/Left Ankle Right;Left    Right Ankle Dorsiflexion -5    Left Ankle Dorsiflexion -5      Strength   Overall Strength Comments BLE strength 4+/5 except L hip abduction/extension 4-/5 and L PF 3+/5      Palpation   Palpation comment tender to palpation B ankle joint line, gastroc/soleus, ant tib, plantar fascia all L>R      Special Tests    Special Tests Ankle/Foot Special Tests    Ankle/Foot Special Tests  Anterior Drawer Test;Great Toe Extension Test       Anterior Drawer Test   Findings Negative    Side  Left;Right      Great Toe Extension Test    Comments positive B, L>R                      Objective measurements completed on examination: See above findings.       [facility] Adult PT Treatment/Exercise - 11/14/19 0001      Exercises   Exercises Ankle      Ankle Exercises: Stretches   Soleus Stretch 1 rep;30 seconds    Gastroc Stretch 1 rep;30 seconds    Gastroc Stretch Limitations tried in standing; switched to longsitting      Ankle Exercises: Standing   SLS single leg balance with tapping to target      Ankle Exercises: Seated   Towel Crunch 5 reps    Heel Raises 10 reps;Both    Toe Raise 10 reps                  PT Education - 11/14/19 1717    Education Details Pt educated on POC and HEP    Person(s) Educated Patient    Methods Explanation;Demonstration;Handout    Comprehension Verbalized understanding;Returned demonstration            PT Short Term Goals - 11/14/19 1723      PT SHORT TERM GOAL #1   Title Pt will be independent with HEP    Time 2    Period Weeks    Status New    Target Date 11/28/19             PT Long Term Goals - 11/14/19 1723      PT LONG TERM GOAL #1   Title Pt will demonstrate or report ability to walk >30 minutes with no reports of increased foot pain    Baseline 15 minutes    Time 8    Period Weeks    Status New    Target Date 01/09/20      PT LONG TERM GOAL #2   Title Pt will demonstrate B PF and hip abd strength 4+/5    Baseline PF: 3+/5, hip abd:  4-/5    Time 8    Period Weeks    Status New    Target Date 01/09/20      PT LONG TERM GOAL #3   Title Pt will report reduction in B foot pain by 50%    Time 8    Period Weeks    Status New    Target Date 01/09/20      PT LONG TERM GOAL #4   Title Pt will report ability to return to gym with no increase in foot pain with activity    Time 6    Period Weeks    Status New    Target Date 01/09/20       PT LONG TERM GOAL #5   Title Pt will demonstrate B ankle DF [facility]    Baseline -10 AROM, -5 PROM    Time 8    Period Weeks    Status New    Target Date 01/09/20                  Plan - 11/14/19 1719    Clinical Impression Statement Pt presents to clinic with reports of chronic B plantar fascitis >9 years; worsening over the past 6 months following a fall down the stairs with L ankle sprain. Pt demos B ankle instability, tenderness to palpation ankle joint line/plantar fascia/gastroc, pain and weakness of PF, pain with great toe extension, and balance deficits (all L>R). Pt recently acquired custom orthotics which she reports are helping a lot. She has been avoiding exercise and walking for more than 15 min at a time for years d/t pain. Pt would benefit from skilled PT to address the above impairments.    Personal Factors and Comorbidities Time since onset of injury/illness/exacerbation    Examination-Activity Limitations Stand;Locomotion Level;Stairs    Examination-Participation Restrictions Community Activity;Interpersonal Relationship    Stability/Clinical Decision Making Stable/Uncomplicated    Clinical Decision Making Low    Rehab Potential Good    PT Frequency 2x / week    PT Duration 8 weeks    PT Treatment/Interventions ADLs/Self Care Home Management;Electrical Stimulation;Iontophoresis 4mg /ml Dexamethasone;Moist Heat;Ultrasound;Neuromuscular re-education;Balance training;Therapeutic exercise;Therapeutic activities;Functional mobility training;Stair training;Gait training;Patient/family education;Manual techniques;Dry needling;Passive range of motion;Taping    PT Next Visit Plan review HEP, ankle stability and strengthening, LE flexibility, hip abd strength, manual/modalities as indicated    PT Home Exercise Plan seated heel/toe raise, towel scrunches, gastroc/soleus stretch with strap, SLS with directional tapping    Consulted and Agree with Plan of Care Patient            Patient will benefit from skilled therapeutic intervention in order to improve the following deficits and impairments:  Abnormal gait, Decreased range of motion, Difficulty walking, Decreased activity tolerance, Pain, Decreased balance, Hypomobility, Decreased strength  Visit Diagnosis: Pain in left ankle and joints of left foot  Pain in left foot  Pain in right foot  Muscle weakness (generalized)     Problem List Patient Active Problem List   Diagnosis Date Noted  . Abnormal auditory perception of right ear 07/13/2019  . Referred otalgia, right 07/13/2019  . 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 Allergic rhinitis 02/02/2019  . Sensation of fullness in left ear 02/02/2019  . Sinus pain 02/02/2019  . Genetic testing 01/06/2019  . Family history of breast cancer   . Anemia 06/15/2018  . NAUSEA 10/23/2008  . DYSPEPSIA 09/12/2008  . ABDOMINAL PAIN RIGHT UPPER QUADRANT 07/20/2007   Lysle Rubens, PT, DPT Maryanna Shape Dalexa Gentz  11/14/2019, 5:27 PM  [facility] [address] [place], [place], [address] Phone: [phone]   Fax:  [phone]  Name: Patricia Whitehead MRN: [id number] Date of Birth: 04/26/1986

## 2019-11-14 NOTE — Patient Instructions (Signed)
Access Code: [email or web address] URL: [email or web address] Date: 11/14/2019 Prepared by: Lysle Rubens  Exercises Gastroc Stretch on Wall - 1 x daily - 7 x weekly - 3 sets - 2 reps - 30 sec hold Long Sitting Calf Stretch with Strap - 1 x daily - 7 x weekly - 3 sets - 10 reps Seated Heel Raise - 1 x daily - 7 x weekly - 3 sets - 10 reps Seated Toe Raise - 1 x daily - 7 x weekly - 3 sets - 10 reps Single Leg Balance with Opposite Leg Star Reach - 1 x daily - 7 x weekly - 3 sets - 10 reps Towel Scrunches - 1 x daily - 7 x weekly - 3 sets - 10 reps

## 2019-11-15 DIAGNOSIS — J3081 Allergic rhinitis due to animal (cat) (dog) hair and dander: Secondary | ICD-10-CM | POA: Diagnosis not present

## 2019-11-15 DIAGNOSIS — J3089 Other allergic rhinitis: Secondary | ICD-10-CM | POA: Diagnosis not present

## 2019-11-15 DIAGNOSIS — J301 Allergic rhinitis due to pollen: Secondary | ICD-10-CM | POA: Diagnosis not present

## 2019-11-20 ENCOUNTER — Ambulatory Visit: Payer: Medicaid Other | Admitting: Physical Therapy

## 2019-11-22 DIAGNOSIS — J3081 Allergic rhinitis due to animal (cat) (dog) hair and dander: Secondary | ICD-10-CM | POA: Diagnosis not present

## 2019-11-22 DIAGNOSIS — J301 Allergic rhinitis due to pollen: Secondary | ICD-10-CM | POA: Diagnosis not present

## 2019-11-22 DIAGNOSIS — J3089 Other allergic rhinitis: Secondary | ICD-10-CM | POA: Diagnosis not present

## 2019-11-23 ENCOUNTER — Ambulatory Visit: Payer: Medicaid Other | Admitting: Physical Therapy

## 2019-12-06 DIAGNOSIS — J301 Allergic rhinitis due to pollen: Secondary | ICD-10-CM | POA: Diagnosis not present

## 2019-12-06 DIAGNOSIS — J3089 Other allergic rhinitis: Secondary | ICD-10-CM | POA: Diagnosis not present

## 2019-12-06 DIAGNOSIS — J3081 Allergic rhinitis due to animal (cat) (dog) hair and dander: Secondary | ICD-10-CM | POA: Diagnosis not present

## 2019-12-08 DIAGNOSIS — J3089 Other allergic rhinitis: Secondary | ICD-10-CM | POA: Diagnosis not present

## 2019-12-08 DIAGNOSIS — J3081 Allergic rhinitis due to animal (cat) (dog) hair and dander: Secondary | ICD-10-CM | POA: Diagnosis not present

## 2019-12-08 DIAGNOSIS — J301 Allergic rhinitis due to pollen: Secondary | ICD-10-CM | POA: Diagnosis not present

## 2019-12-13 DIAGNOSIS — J301 Allergic rhinitis due to pollen: Secondary | ICD-10-CM | POA: Diagnosis not present

## 2019-12-13 DIAGNOSIS — J3089 Other allergic rhinitis: Secondary | ICD-10-CM | POA: Diagnosis not present

## 2019-12-13 DIAGNOSIS — J3081 Allergic rhinitis due to animal (cat) (dog) hair and dander: Secondary | ICD-10-CM | POA: Diagnosis not present

## 2019-12-15 DIAGNOSIS — J3089 Other allergic rhinitis: Secondary | ICD-10-CM | POA: Diagnosis not present

## 2019-12-15 DIAGNOSIS — J301 Allergic rhinitis due to pollen: Secondary | ICD-10-CM | POA: Diagnosis not present

## 2019-12-15 DIAGNOSIS — J3081 Allergic rhinitis due to animal (cat) (dog) hair and dander: Secondary | ICD-10-CM | POA: Diagnosis not present

## 2019-12-20 DIAGNOSIS — J301 Allergic rhinitis due to pollen: Secondary | ICD-10-CM | POA: Diagnosis not present

## 2019-12-20 DIAGNOSIS — J3081 Allergic rhinitis due to animal (cat) (dog) hair and dander: Secondary | ICD-10-CM | POA: Diagnosis not present

## 2019-12-20 DIAGNOSIS — J3089 Other allergic rhinitis: Secondary | ICD-10-CM | POA: Diagnosis not present

## 2019-12-28 DIAGNOSIS — J3081 Allergic rhinitis due to animal (cat) (dog) hair and dander: Secondary | ICD-10-CM | POA: Diagnosis not present

## 2019-12-28 DIAGNOSIS — J301 Allergic rhinitis due to pollen: Secondary | ICD-10-CM | POA: Diagnosis not present

## 2019-12-28 DIAGNOSIS — J3089 Other allergic rhinitis: Secondary | ICD-10-CM | POA: Diagnosis not present

## 2020-01-05 DIAGNOSIS — J3089 Other allergic rhinitis: Secondary | ICD-10-CM | POA: Diagnosis not present

## 2020-01-05 DIAGNOSIS — J3081 Allergic rhinitis due to animal (cat) (dog) hair and dander: Secondary | ICD-10-CM | POA: Diagnosis not present

## 2020-01-05 DIAGNOSIS — J301 Allergic rhinitis due to pollen: Secondary | ICD-10-CM | POA: Diagnosis not present

## 2020-01-09 DIAGNOSIS — J3089 Other allergic rhinitis: Secondary | ICD-10-CM | POA: Diagnosis not present

## 2020-01-09 DIAGNOSIS — J3081 Allergic rhinitis due to animal (cat) (dog) hair and dander: Secondary | ICD-10-CM | POA: Diagnosis not present

## 2020-01-09 DIAGNOSIS — J301 Allergic rhinitis due to pollen: Secondary | ICD-10-CM | POA: Diagnosis not present

## 2020-01-17 DIAGNOSIS — J301 Allergic rhinitis due to pollen: Secondary | ICD-10-CM | POA: Diagnosis not present

## 2020-01-17 DIAGNOSIS — J3081 Allergic rhinitis due to animal (cat) (dog) hair and dander: Secondary | ICD-10-CM | POA: Diagnosis not present

## 2020-01-17 DIAGNOSIS — J3089 Other allergic rhinitis: Secondary | ICD-10-CM | POA: Diagnosis not present

## 2020-02-09 DIAGNOSIS — J3089 Other allergic rhinitis: Secondary | ICD-10-CM | POA: Diagnosis not present

## 2020-02-09 DIAGNOSIS — J3081 Allergic rhinitis due to animal (cat) (dog) hair and dander: Secondary | ICD-10-CM | POA: Diagnosis not present

## 2020-02-09 DIAGNOSIS — J301 Allergic rhinitis due to pollen: Secondary | ICD-10-CM | POA: Diagnosis not present

## 2020-02-14 DIAGNOSIS — J3089 Other allergic rhinitis: Secondary | ICD-10-CM | POA: Diagnosis not present

## 2020-02-14 DIAGNOSIS — J3081 Allergic rhinitis due to animal (cat) (dog) hair and dander: Secondary | ICD-10-CM | POA: Diagnosis not present

## 2020-02-14 DIAGNOSIS — J301 Allergic rhinitis due to pollen: Secondary | ICD-10-CM | POA: Diagnosis not present

## 2020-03-08 DIAGNOSIS — Z789 Other specified health status: Secondary | ICD-10-CM | POA: Diagnosis not present

## 2020-03-08 DIAGNOSIS — I10 Essential (primary) hypertension: Secondary | ICD-10-CM | POA: Diagnosis not present

## 2020-03-08 DIAGNOSIS — J301 Allergic rhinitis due to pollen: Secondary | ICD-10-CM | POA: Diagnosis not present

## 2020-03-08 DIAGNOSIS — K219 Gastro-esophageal reflux disease without esophagitis: Secondary | ICD-10-CM | POA: Diagnosis not present

## 2020-03-08 DIAGNOSIS — N3000 Acute cystitis without hematuria: Secondary | ICD-10-CM | POA: Diagnosis not present

## 2020-04-04 DIAGNOSIS — Z789 Other specified health status: Secondary | ICD-10-CM | POA: Diagnosis not present

## 2020-04-04 DIAGNOSIS — K219 Gastro-esophageal reflux disease without esophagitis: Secondary | ICD-10-CM | POA: Diagnosis not present

## 2020-04-04 DIAGNOSIS — I10 Essential (primary) hypertension: Secondary | ICD-10-CM | POA: Diagnosis not present

## 2020-04-04 DIAGNOSIS — J301 Allergic rhinitis due to pollen: Secondary | ICD-10-CM | POA: Diagnosis not present

## 2020-04-04 DIAGNOSIS — B001 Herpesviral vesicular dermatitis: Secondary | ICD-10-CM | POA: Diagnosis not present

## 2020-04-17 DIAGNOSIS — K219 Gastro-esophageal reflux disease without esophagitis: Secondary | ICD-10-CM | POA: Diagnosis not present

## 2020-04-17 DIAGNOSIS — Z789 Other specified health status: Secondary | ICD-10-CM | POA: Diagnosis not present

## 2020-04-17 DIAGNOSIS — J301 Allergic rhinitis due to pollen: Secondary | ICD-10-CM | POA: Diagnosis not present

## 2020-04-17 DIAGNOSIS — B001 Herpesviral vesicular dermatitis: Secondary | ICD-10-CM | POA: Diagnosis not present

## 2020-04-17 DIAGNOSIS — I10 Essential (primary) hypertension: Secondary | ICD-10-CM | POA: Diagnosis not present

## 2020-04-25 DIAGNOSIS — J3089 Other allergic rhinitis: Secondary | ICD-10-CM | POA: Diagnosis not present

## 2020-04-25 DIAGNOSIS — J301 Allergic rhinitis due to pollen: Secondary | ICD-10-CM | POA: Diagnosis not present

## 2020-04-25 DIAGNOSIS — J3081 Allergic rhinitis due to animal (cat) (dog) hair and dander: Secondary | ICD-10-CM | POA: Diagnosis not present

## 2020-05-02 DIAGNOSIS — K219 Gastro-esophageal reflux disease without esophagitis: Secondary | ICD-10-CM | POA: Diagnosis not present

## 2020-05-02 DIAGNOSIS — F418 Other specified anxiety disorders: Secondary | ICD-10-CM | POA: Diagnosis not present

## 2020-05-02 DIAGNOSIS — Z789 Other specified health status: Secondary | ICD-10-CM | POA: Diagnosis not present

## 2020-05-02 DIAGNOSIS — I10 Essential (primary) hypertension: Secondary | ICD-10-CM | POA: Diagnosis not present

## 2020-05-02 DIAGNOSIS — J301 Allergic rhinitis due to pollen: Secondary | ICD-10-CM | POA: Diagnosis not present

## 2020-05-02 DIAGNOSIS — B001 Herpesviral vesicular dermatitis: Secondary | ICD-10-CM | POA: Diagnosis not present

## 2020-05-21 DIAGNOSIS — I10 Essential (primary) hypertension: Secondary | ICD-10-CM | POA: Diagnosis not present

## 2020-05-21 DIAGNOSIS — K219 Gastro-esophageal reflux disease without esophagitis: Secondary | ICD-10-CM | POA: Diagnosis not present

## 2020-05-21 DIAGNOSIS — B001 Herpesviral vesicular dermatitis: Secondary | ICD-10-CM | POA: Diagnosis not present

## 2020-05-21 DIAGNOSIS — Z789 Other specified health status: Secondary | ICD-10-CM | POA: Diagnosis not present

## 2020-05-21 DIAGNOSIS — F418 Other specified anxiety disorders: Secondary | ICD-10-CM | POA: Diagnosis not present

## 2020-05-21 DIAGNOSIS — J301 Allergic rhinitis due to pollen: Secondary | ICD-10-CM | POA: Diagnosis not present

## 2020-06-04 DIAGNOSIS — F418 Other specified anxiety disorders: Secondary | ICD-10-CM | POA: Diagnosis not present

## 2020-06-04 DIAGNOSIS — I10 Essential (primary) hypertension: Secondary | ICD-10-CM | POA: Diagnosis not present

## 2020-06-04 DIAGNOSIS — B001 Herpesviral vesicular dermatitis: Secondary | ICD-10-CM | POA: Diagnosis not present

## 2020-06-04 DIAGNOSIS — R109 Unspecified abdominal pain: Secondary | ICD-10-CM | POA: Diagnosis not present

## 2020-06-04 DIAGNOSIS — J301 Allergic rhinitis due to pollen: Secondary | ICD-10-CM | POA: Diagnosis not present

## 2020-06-04 DIAGNOSIS — E7800 Pure hypercholesterolemia, unspecified: Secondary | ICD-10-CM | POA: Diagnosis not present

## 2020-06-04 DIAGNOSIS — Z131 Encounter for screening for diabetes mellitus: Secondary | ICD-10-CM | POA: Diagnosis not present

## 2020-06-04 DIAGNOSIS — K219 Gastro-esophageal reflux disease without esophagitis: Secondary | ICD-10-CM | POA: Diagnosis not present

## 2020-06-04 DIAGNOSIS — Z789 Other specified health status: Secondary | ICD-10-CM | POA: Diagnosis not present

## 2020-06-11 DIAGNOSIS — I10 Essential (primary) hypertension: Secondary | ICD-10-CM | POA: Diagnosis not present

## 2020-06-11 DIAGNOSIS — B001 Herpesviral vesicular dermatitis: Secondary | ICD-10-CM | POA: Diagnosis not present

## 2020-06-11 DIAGNOSIS — F418 Other specified anxiety disorders: Secondary | ICD-10-CM | POA: Diagnosis not present

## 2020-06-11 DIAGNOSIS — E7800 Pure hypercholesterolemia, unspecified: Secondary | ICD-10-CM | POA: Diagnosis not present

## 2020-06-11 DIAGNOSIS — J301 Allergic rhinitis due to pollen: Secondary | ICD-10-CM | POA: Diagnosis not present

## 2020-06-11 DIAGNOSIS — Z789 Other specified health status: Secondary | ICD-10-CM | POA: Diagnosis not present

## 2020-06-11 DIAGNOSIS — K219 Gastro-esophageal reflux disease without esophagitis: Secondary | ICD-10-CM | POA: Diagnosis not present

## 2020-06-14 DIAGNOSIS — J301 Allergic rhinitis due to pollen: Secondary | ICD-10-CM | POA: Diagnosis not present

## 2020-06-14 DIAGNOSIS — J3089 Other allergic rhinitis: Secondary | ICD-10-CM | POA: Diagnosis not present

## 2020-06-14 DIAGNOSIS — J3081 Allergic rhinitis due to animal (cat) (dog) hair and dander: Secondary | ICD-10-CM | POA: Diagnosis not present

## 2020-06-19 DIAGNOSIS — J3081 Allergic rhinitis due to animal (cat) (dog) hair and dander: Secondary | ICD-10-CM | POA: Diagnosis not present

## 2020-06-19 DIAGNOSIS — J301 Allergic rhinitis due to pollen: Secondary | ICD-10-CM | POA: Diagnosis not present

## 2020-06-19 DIAGNOSIS — J3089 Other allergic rhinitis: Secondary | ICD-10-CM | POA: Diagnosis not present

## 2020-07-03 DIAGNOSIS — J3081 Allergic rhinitis due to animal (cat) (dog) hair and dander: Secondary | ICD-10-CM | POA: Diagnosis not present

## 2020-07-03 DIAGNOSIS — J301 Allergic rhinitis due to pollen: Secondary | ICD-10-CM | POA: Diagnosis not present

## 2020-07-03 DIAGNOSIS — J3089 Other allergic rhinitis: Secondary | ICD-10-CM | POA: Diagnosis not present

## 2020-07-10 DIAGNOSIS — J301 Allergic rhinitis due to pollen: Secondary | ICD-10-CM | POA: Diagnosis not present

## 2020-07-10 DIAGNOSIS — J3081 Allergic rhinitis due to animal (cat) (dog) hair and dander: Secondary | ICD-10-CM | POA: Diagnosis not present

## 2020-07-10 DIAGNOSIS — J3089 Other allergic rhinitis: Secondary | ICD-10-CM | POA: Diagnosis not present

## 2020-07-16 DIAGNOSIS — J301 Allergic rhinitis due to pollen: Secondary | ICD-10-CM | POA: Diagnosis not present

## 2020-07-16 DIAGNOSIS — I10 Essential (primary) hypertension: Secondary | ICD-10-CM | POA: Diagnosis not present

## 2020-07-16 DIAGNOSIS — K219 Gastro-esophageal reflux disease without esophagitis: Secondary | ICD-10-CM | POA: Diagnosis not present

## 2020-07-16 DIAGNOSIS — F418 Other specified anxiety disorders: Secondary | ICD-10-CM | POA: Diagnosis not present

## 2020-07-17 DIAGNOSIS — J3081 Allergic rhinitis due to animal (cat) (dog) hair and dander: Secondary | ICD-10-CM | POA: Diagnosis not present

## 2020-07-17 DIAGNOSIS — J301 Allergic rhinitis due to pollen: Secondary | ICD-10-CM | POA: Diagnosis not present

## 2020-07-17 DIAGNOSIS — J3089 Other allergic rhinitis: Secondary | ICD-10-CM | POA: Diagnosis not present

## 2020-07-24 DIAGNOSIS — J3081 Allergic rhinitis due to animal (cat) (dog) hair and dander: Secondary | ICD-10-CM | POA: Diagnosis not present

## 2020-07-24 DIAGNOSIS — J301 Allergic rhinitis due to pollen: Secondary | ICD-10-CM | POA: Diagnosis not present

## 2020-07-24 DIAGNOSIS — J3089 Other allergic rhinitis: Secondary | ICD-10-CM | POA: Diagnosis not present

## 2020-07-31 DIAGNOSIS — J3081 Allergic rhinitis due to animal (cat) (dog) hair and dander: Secondary | ICD-10-CM | POA: Diagnosis not present

## 2020-07-31 DIAGNOSIS — J3089 Other allergic rhinitis: Secondary | ICD-10-CM | POA: Diagnosis not present

## 2020-07-31 DIAGNOSIS — J301 Allergic rhinitis due to pollen: Secondary | ICD-10-CM | POA: Diagnosis not present

## 2020-08-07 DIAGNOSIS — J3081 Allergic rhinitis due to animal (cat) (dog) hair and dander: Secondary | ICD-10-CM | POA: Diagnosis not present

## 2020-08-07 DIAGNOSIS — J3089 Other allergic rhinitis: Secondary | ICD-10-CM | POA: Diagnosis not present

## 2020-08-07 DIAGNOSIS — J301 Allergic rhinitis due to pollen: Secondary | ICD-10-CM | POA: Diagnosis not present

## 2020-08-23 DIAGNOSIS — J3081 Allergic rhinitis due to animal (cat) (dog) hair and dander: Secondary | ICD-10-CM | POA: Diagnosis not present

## 2020-08-23 DIAGNOSIS — B999 Unspecified infectious disease: Secondary | ICD-10-CM | POA: Diagnosis not present

## 2020-08-23 DIAGNOSIS — J301 Allergic rhinitis due to pollen: Secondary | ICD-10-CM | POA: Diagnosis not present

## 2020-08-23 DIAGNOSIS — J3089 Other allergic rhinitis: Secondary | ICD-10-CM | POA: Diagnosis not present

## 2020-08-28 DIAGNOSIS — J3089 Other allergic rhinitis: Secondary | ICD-10-CM | POA: Diagnosis not present

## 2020-08-28 DIAGNOSIS — J3081 Allergic rhinitis due to animal (cat) (dog) hair and dander: Secondary | ICD-10-CM | POA: Diagnosis not present

## 2020-08-28 DIAGNOSIS — J301 Allergic rhinitis due to pollen: Secondary | ICD-10-CM | POA: Diagnosis not present

## 2020-09-04 ENCOUNTER — Other Ambulatory Visit: Payer: Self-pay

## 2020-09-04 ENCOUNTER — Encounter (HOSPITAL_COMMUNITY): Payer: Self-pay | Admitting: Emergency Medicine

## 2020-09-04 ENCOUNTER — Emergency Department (HOSPITAL_COMMUNITY)
Admission: EM | Admit: 2020-09-04 | Discharge: 2020-09-05 | Disposition: A | Discharge status: Home / Self Care | Payer: Medicaid Other | Attending: Emergency Medicine | Admitting: Emergency Medicine

## 2020-09-04 DIAGNOSIS — F12188 Cannabis abuse with other cannabis-induced disorder: Secondary | ICD-10-CM | POA: Diagnosis not present

## 2020-09-04 DIAGNOSIS — K820 Obstruction of gallbladder: Secondary | ICD-10-CM | POA: Diagnosis not present

## 2020-09-04 DIAGNOSIS — Z79899 Other long term (current) drug therapy: Secondary | ICD-10-CM | POA: Insufficient documentation

## 2020-09-04 DIAGNOSIS — R1012 Left upper quadrant pain: Secondary | ICD-10-CM | POA: Diagnosis not present

## 2020-09-04 DIAGNOSIS — I10 Essential (primary) hypertension: Secondary | ICD-10-CM | POA: Diagnosis not present

## 2020-09-04 DIAGNOSIS — K219 Gastro-esophageal reflux disease without esophagitis: Secondary | ICD-10-CM | POA: Diagnosis not present

## 2020-09-04 DIAGNOSIS — R112 Nausea with vomiting, unspecified: Secondary | ICD-10-CM | POA: Diagnosis not present

## 2020-09-04 DIAGNOSIS — R1111 Vomiting without nausea: Secondary | ICD-10-CM | POA: Diagnosis not present

## 2020-09-04 DIAGNOSIS — R0902 Hypoxemia: Secondary | ICD-10-CM | POA: Diagnosis not present

## 2020-09-04 DIAGNOSIS — I959 Hypotension, unspecified: Secondary | ICD-10-CM | POA: Diagnosis not present

## 2020-09-04 DIAGNOSIS — R1084 Generalized abdominal pain: Secondary | ICD-10-CM | POA: Diagnosis not present

## 2020-09-04 DIAGNOSIS — Z91040 Latex allergy status: Secondary | ICD-10-CM | POA: Insufficient documentation

## 2020-09-04 NOTE — ED Triage Notes (Signed)
Pt c/o LUQ pain x1 hour. PMH of colitis. Pt currently experiencing large amounts of emesis. 4mg  zofran and fentanyl given by ems

## 2020-09-05 ENCOUNTER — Encounter (HOSPITAL_COMMUNITY): Payer: Self-pay | Admitting: Emergency Medicine

## 2020-09-05 ENCOUNTER — Emergency Department (HOSPITAL_COMMUNITY): Payer: Medicaid Other

## 2020-09-05 DIAGNOSIS — F12188 Cannabis abuse with other cannabis-induced disorder: Secondary | ICD-10-CM | POA: Diagnosis not present

## 2020-09-05 DIAGNOSIS — K820 Obstruction of gallbladder: Secondary | ICD-10-CM | POA: Diagnosis not present

## 2020-09-05 DIAGNOSIS — I10 Essential (primary) hypertension: Secondary | ICD-10-CM | POA: Diagnosis not present

## 2020-09-05 LAB — I-STAT CHEM 8, ED
BUN: 21 mg/dL — ABNORMAL HIGH (ref 6–20)
Calcium, Ion: 1.09 mmol/L — ABNORMAL LOW (ref 1.15–1.40)
Chloride: 104 mmol/L (ref 98–111)
Creatinine, Ser: 0.7 mg/dL (ref 0.44–1.00)
Glucose, Bld: 150 mg/dL — ABNORMAL HIGH (ref 70–99)
HCT: 36 % (ref 36.0–46.0)
Hemoglobin: 12.2 g/dL (ref 12.0–15.0)
Potassium: 3 mmol/L — ABNORMAL LOW (ref 3.5–5.1)
Sodium: 139 mmol/L (ref 135–145)
TCO2: 20 mmol/L — ABNORMAL LOW (ref 22–32)

## 2020-09-05 LAB — URINALYSIS, ROUTINE W REFLEX MICROSCOPIC
Bilirubin Urine: NEGATIVE
Glucose, UA: NEGATIVE mg/dL
Ketones, ur: 5 mg/dL — AB
Nitrite: NEGATIVE
Protein, ur: NEGATIVE mg/dL
Specific Gravity, Urine: 1.017 (ref 1.005–1.030)
pH: 7 (ref 5.0–8.0)

## 2020-09-05 LAB — CBC WITH DIFFERENTIAL/PLATELET
Abs Immature Granulocytes: 0.11 10*3/uL — ABNORMAL HIGH (ref 0.00–0.07)
Basophils Absolute: 0.1 10*3/uL (ref 0.0–0.1)
Basophils Relative: 0 %
Eosinophils Absolute: 0.2 10*3/uL (ref 0.0–0.5)
Eosinophils Relative: 1 %
HCT: 34.9 % — ABNORMAL LOW (ref 36.0–46.0)
Hemoglobin: 11.9 g/dL — ABNORMAL LOW (ref 12.0–15.0)
Immature Granulocytes: 1 %
Lymphocytes Relative: 19 %
Lymphs Abs: 3.7 10*3/uL (ref 0.7–4.0)
MCH: 33.5 pg (ref 26.0–34.0)
MCHC: 34.1 g/dL (ref 30.0–36.0)
MCV: 98.3 fL (ref 80.0–100.0)
Monocytes Absolute: 1.3 10*3/uL — ABNORMAL HIGH (ref 0.1–1.0)
Monocytes Relative: 7 %
Neutro Abs: 14 10*3/uL — ABNORMAL HIGH (ref 1.7–7.7)
Neutrophils Relative %: 72 %
Platelets: 334 10*3/uL (ref 150–400)
RBC: 3.55 MIL/uL — ABNORMAL LOW (ref 3.87–5.11)
RDW: 13 % (ref 11.5–15.5)
WBC: 19.3 10*3/uL — ABNORMAL HIGH (ref 4.0–10.5)
nRBC: 0 % (ref 0.0–0.2)

## 2020-09-05 LAB — HEPATIC FUNCTION PANEL
ALT: 32 U/L (ref 0–44)
AST: 43 U/L — ABNORMAL HIGH (ref 15–41)
Albumin: 4.6 g/dL (ref 3.5–5.0)
Alkaline Phosphatase: 59 U/L (ref 38–126)
Bilirubin, Direct: 0.1 mg/dL (ref 0.0–0.2)
Indirect Bilirubin: 0.4 mg/dL (ref 0.3–0.9)
Total Bilirubin: 0.5 mg/dL (ref 0.3–1.2)
Total Protein: 8.3 g/dL — ABNORMAL HIGH (ref 6.5–8.1)

## 2020-09-05 LAB — I-STAT BETA HCG BLOOD, ED (MC, WL, AP ONLY): I-stat hCG, quantitative: <5 m[IU]/mL (ref <5)

## 2020-09-05 MED ORDER — HALOPERIDOL LACTATE 5 MG/ML IJ SOLN
3.0000 mg | Freq: Once | INTRAMUSCULAR | Status: AC
  Filled 2020-09-05: qty 1

## 2020-09-05 MED ORDER — HALOPERIDOL LACTATE 5 MG/ML IJ SOLN
2.0000 mg | Freq: Once | INTRAMUSCULAR | Status: AC
  Administered 2020-09-05: 2 mg via INTRAVENOUS

## 2020-09-05 MED ORDER — SODIUM CHLORIDE 0.9 % IV BOLUS
500.0000 mL | Freq: Once | INTRAVENOUS | Status: AC
  Administered 2020-09-05: 500 mL via INTRAVENOUS

## 2020-09-05 MED ORDER — HALOPERIDOL LACTATE 5 MG/ML IJ SOLN
INTRAMUSCULAR | Status: AC
  Administered 2020-09-05: 3 mg via INTRAVENOUS
  Filled 2020-09-05: qty 1

## 2020-09-05 MED ORDER — FAMOTIDINE 20 MG PO TABS: 20.0000 mg | ORAL_TABLET | Freq: Two times a day (BID) | ORAL | 0 refills | Status: AC

## 2020-09-05 MED ORDER — KETOROLAC TROMETHAMINE 30 MG/ML IJ SOLN
INTRAMUSCULAR | Status: AC
  Administered 2020-09-05: 30 mg via INTRAVENOUS
  Filled 2020-09-05: qty 1

## 2020-09-05 MED ORDER — KETOROLAC TROMETHAMINE 30 MG/ML IJ SOLN
30.0000 mg | Freq: Once | INTRAMUSCULAR | Status: AC
  Administered 2020-09-05: 30 mg via INTRAVENOUS
  Filled 2020-09-05: qty 1

## 2020-09-05 MED ORDER — ONDANSETRON 4 MG PO TBDP: 4.0000 mg | ORAL_TABLET | Freq: Three times a day (TID) | ORAL | 0 refills | Status: AC | PRN

## 2020-09-05 NOTE — ED Provider Notes (Signed)
[facility]-EMERGENCY DEPT Provider Note   CSN: [id number] Arrival date & time: 09/04/20  2337     History Chief Complaint  Patient presents with  . Abdominal Pain    Patricia Whitehead is a 35 y.o. female.  The history is provided by the patient.  Abdominal Pain Pain location:  LUQ Pain quality: cramping   Pain radiates to:  Does not radiate Pain severity:  Severe Onset quality:  Sudden Timing:  Constant Progression:  Unchanged Chronicity:  Recurrent Context: not alcohol use   Relieved by:  Nothing Worsened by:  Nothing Ineffective treatments:  None tried Associated symptoms: nausea and vomiting   Associated symptoms: no anorexia, no chest pain, no chills, no cough, no diarrhea and no fever   Risk factors: no alcohol abuse        Past Medical History:  Diagnosis Date  . Allergy   . Anemia   . Anxiety   . Depression   . Family history of breast cancer   . GERD (gastroesophageal reflux disease)   . Hypertension   . IBS (irritable bowel syndrome)   . Menorrhagia with irregular cycle   . PTSD (post-traumatic stress disorder)     Patient Active Problem List   Diagnosis Date Noted  . Abnormal auditory perception of right ear 07/13/2019  . Referred otalgia, right 07/13/2019  . 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 Allergic rhinitis 02/02/2019  . Sensation of fullness in left ear 02/02/2019  . Sinus pain 02/02/2019  . Genetic testing 01/06/2019  . Family history of breast cancer   . Anemia 06/15/2018  . NAUSEA 10/23/2008  . DYSPEPSIA 09/12/2008  . ABDOMINAL PAIN RIGHT UPPER QUADRANT 07/20/2007    Past Surgical History:  Procedure Laterality Date  . NO PAST SURGERIES       OB History   No obstetric history on file.     Family History  Problem Relation Age of Onset  . Diabetes Mother   . Breast cancer Mother 2744  . Colon cancer Neg Hx   . Esophageal cancer Neg Hx   . Rectal cancer Neg Hx   . Stomach cancer Neg Hx      Social History   Tobacco Use  . Smoking status: Never Smoker  . Smokeless tobacco: Never Used  Vaping Use  . Vaping Use: Never used  Substance Use Topics  . Alcohol use: No  . Drug use: No    Home Medications Prior to Admission medications   Medication Sig Start Date End Date Taking? Authorizing Provider  albuterol (VENTOLIN HFA) 108 (90 Base) MCG/ACT inhaler ProAir HFA 90 mcg/actuation aerosol inhaler  2 PUFFS AND HELD EVERY 6 TO 8 HOURS AS NEEDED FOR COUGH, WHEEZE, OR SHORTNESS OF BREATH   Yes [provider]  busPIRone (BUSPAR) 5 MG tablet Take 5 mg by mouth 3 (three) times daily. 08/22/19  Yes [provider]  cetirizine (ZYRTEC) 10 MG tablet Take 10 mg by mouth daily.   Yes [provider]  diphenhydrAMINE (BENADRYL) 25 mg capsule Take 25 mg by mouth at bedtime as needed for allergies.   Yes [provider]  losartan (COZAAR) 25 MG tablet Take 25 mg by mouth daily. 12/17/18  Yes [provider]  norgestimate-ethinyl estradiol (ORTHO-CYCLEN) 0.25-35 MG-MCG tablet Take 1 tablet by mouth daily.   Yes [provider]  omeprazole (PRILOSEC) 40 MG capsule Take 40 mg by mouth daily. 05/12/19  Yes [provider]  tretinoin (RETIN-A) 0.025 % cream APPLY  THIN LAYER TO FACE NIGHTLY 09/05/19  Yes [provider]  valACYclovir (VALTREX) 1000 MG tablet Take 1,000 mg by mouth daily. 07/10/19  Yes [provider]  NON FORMULARY Allergy shots weekly    [provider]  ondansetron (ZOFRAN-ODT) 4 MG disintegrating tablet Take 1 tablet (4 mg total) by mouth every 6 (six) hours as needed. Patient not taking: Reported on 09/05/2020 08/23/19   Sherrilyn Rist, MD    Allergies    Other, Clindamycin, Doxycycline, Ketamine, Ketamine hcl, and Latex  Review of Systems   Review of Systems  Constitutional: Negative for chills and fever.  HENT: Negative for congestion.   Eyes: Negative for visual disturbance.   Respiratory: Negative for cough.   Cardiovascular: Negative for chest pain.  Gastrointestinal: Positive for abdominal pain, nausea and vomiting. Negative for anorexia and diarrhea.  Genitourinary: Negative for difficulty urinating.  Musculoskeletal: Negative for arthralgias.  Skin: Negative for rash.  Neurological: Negative for dizziness.  Psychiatric/Behavioral: Negative for agitation.  All other systems reviewed and are negative.   Physical Exam Updated Vital Signs BP 110/63 (BP Location: Left Arm)   Pulse 60   Temp (!) 97.3 F (36.3 C) (Oral)   Resp 17   LMP 08/28/2020 (Approximate) Comment: negative beta HCG 09/05/20  SpO2 100%   Physical Exam Vitals and nursing note reviewed.  Constitutional:      General: She is not in acute distress.    Appearance: Normal appearance.     Comments: Smells heavily of marijuana  HENT:     Head: Normocephalic and atraumatic.     Nose: Nose normal.  Eyes:     Conjunctiva/sclera: Conjunctivae normal.     Pupils: Pupils are equal, round, and reactive to light.  Cardiovascular:     Rate and Rhythm: Normal rate and regular rhythm.     Pulses: Normal pulses.  Pulmonary:     Effort: Pulmonary effort is normal.     Breath sounds: Normal breath sounds.  Abdominal:     General: Abdomen is flat. Bowel sounds are normal.     Palpations: Abdomen is soft.     Tenderness: There is no abdominal tenderness. There is no guarding.  Musculoskeletal:        General: Normal range of motion.     Cervical back: Normal range of motion and neck supple.  Skin:    General: Skin is warm and dry.     Capillary Refill: Capillary refill takes less than 2 seconds.  Neurological:     General: No focal deficit present.     Mental Status: She is alert and oriented to person, place, and time.     Deep Tendon Reflexes: Reflexes normal.  Psychiatric:        Mood and Affect: Mood normal.        Behavior: Behavior normal.     ED Results / Procedures /  Treatments   Labs (all labs ordered are listed, but only abnormal results are displayed) Results for orders placed or performed during the hospital encounter of 09/04/20  CBC with Differential/Platelet  Result Value Ref Range   WBC 19.3 (H) 4.0 - 10.5 K/uL   RBC 3.55 (L) 3.87 - 5.11 MIL/uL   Hemoglobin 11.9 (L) 12.0 - 15.0 g/dL   HCT 16.8 (L) 37.2 - 90.2 %   MCV 98.3 80.0 - 100.0 fL   MCH 33.5 26.0 - 34.0 pg   MCHC 34.1 30.0 - 36.0 g/dL   RDW 11.1 55.2 -  15.5 %   Platelets 334 150 - 400 K/uL   nRBC 0.0 0.0 - 0.2 %   Neutrophils Relative % 72 %   Neutro Abs 14.0 (H) 1.7 - 7.7 K/uL   Lymphocytes Relative 19 %   Lymphs Abs 3.7 0.7 - 4.0 K/uL   Monocytes Relative 7 %   Monocytes Absolute 1.3 (H) 0.1 - 1.0 K/uL   Eosinophils Relative 1 %   Eosinophils Absolute 0.2 0.0 - 0.5 K/uL   Basophils Relative 0 %   Basophils Absolute 0.1 0.0 - 0.1 K/uL   Immature Granulocytes 1 %   Abs Immature Granulocytes 0.11 (H) 0.00 - 0.07 K/uL  Hepatic function panel  Result Value Ref Range   Total Protein 8.3 (H) 6.5 - 8.1 g/dL   Albumin 4.6 3.5 - 5.0 g/dL   AST 43 (H) 15 - 41 U/L   ALT 32 0 - 44 U/L   Alkaline Phosphatase 59 38 - 126 U/L   Total Bilirubin 0.5 0.3 - 1.2 mg/dL   Bilirubin, Direct 0.1 0.0 - 0.2 mg/dL   Indirect Bilirubin 0.4 0.3 - 0.9 mg/dL  Urinalysis, Routine w reflex microscopic Urine, Clean Catch  Result Value Ref Range   Color, Urine YELLOW YELLOW   APPearance CLEAR CLEAR   Specific Gravity, Urine 1.017 1.005 - 1.030   pH 7.0 5.0 - 8.0   Glucose, UA NEGATIVE NEGATIVE mg/dL   Hgb urine dipstick SMALL (A) NEGATIVE   Bilirubin Urine NEGATIVE NEGATIVE   Ketones, ur 5 (A) NEGATIVE mg/dL   Protein, ur NEGATIVE NEGATIVE mg/dL   Nitrite NEGATIVE NEGATIVE   Leukocytes,Ua TRACE (A) NEGATIVE   RBC / HPF 6-10 0 - 5 RBC/hpf   WBC, UA 0-5 0 - 5 WBC/hpf   Bacteria, UA RARE (A) NONE SEEN   Squamous Epithelial / LPF 0-5 0 - 5   Mucus PRESENT   I-stat chem 8, ED (not at [facility] or [facility])   Result Value Ref Range   Sodium 139 135 - 145 mmol/L   Potassium 3.0 (L) 3.5 - 5.1 mmol/L   Chloride 104 98 - 111 mmol/L   BUN 21 (H) 6 - 20 mg/dL   Creatinine, Ser 9.51 0.44 - 1.00 mg/dL   Glucose, Bld 884 (H) 70 - 99 mg/dL   Calcium, Ion 1.66 (L) 1.15 - 1.40 mmol/L   TCO2 20 (L) 22 - 32 mmol/L   Hemoglobin 12.2 12.0 - 15.0 g/dL   HCT 06.3 01.6 - 01.0 %  I-Stat Beta hCG blood, ED ([facility], [facility], [facility] only)  Result Value Ref Range   I-stat hCG, quantitative <5.0 <5 mIU/mL   Comment 3           CT Renal Stone Study  Result Date: 09/05/2020 CLINICAL DATA:  35 year old female with flank pain. Concern for kidney stone. EXAM: CT ABDOMEN AND PELVIS WITHOUT CONTRAST TECHNIQUE: Multidetector CT imaging of the abdomen and pelvis was performed following the standard protocol without IV contrast. COMPARISON:  CT abdomen pelvis dated 06/02/2015. FINDINGS: Evaluation of this exam is limited in the absence of intravenous contrast. Lower chest: The visualized lung bases are clear. No intra-abdominal free air or free fluid. Hepatobiliary: The liver is unremarkable. No intrahepatic biliary dilatation. The gallbladder is contracted. No calcified gallstone. Pancreas: Unremarkable. No pancreatic ductal dilatation or surrounding inflammatory changes. Spleen: Normal in size without focal abnormality. Adrenals/Urinary Tract: The adrenal glands unremarkable. There is no hydronephrosis or nephrolithiasis on either side. The visualized ureters and urinary bladder appear unremarkable.  Stomach/Bowel: There is no bowel obstruction or active inflammation. The appendix is normal. Vascular/Lymphatic: The abdominal aorta and IVC are unremarkable. No portal venous gas. There is no adenopathy. Reproductive: The uterus is retroverted.  No adnexal masses. Other: None Musculoskeletal: No acute or significant osseous findings. IMPRESSION: No acute intra-abdominal or pelvic pathology. No hydronephrosis or nephrolithiasis. Electronically Signed    By: Elgie Collard M.D.   On: 09/05/2020 01:39    Radiology CT Renal Stone Study  Result Date: 09/05/2020 CLINICAL DATA:  35 year old female with flank pain. Concern for kidney stone. EXAM: CT ABDOMEN AND PELVIS WITHOUT CONTRAST TECHNIQUE: Multidetector CT imaging of the abdomen and pelvis was performed following the standard protocol without IV contrast. COMPARISON:  CT abdomen pelvis dated 06/02/2015. FINDINGS: Evaluation of this exam is limited in the absence of intravenous contrast. Lower chest: The visualized lung bases are clear. No intra-abdominal free air or free fluid. Hepatobiliary: The liver is unremarkable. No intrahepatic biliary dilatation. The gallbladder is contracted. No calcified gallstone. Pancreas: Unremarkable. No pancreatic ductal dilatation or surrounding inflammatory changes. Spleen: Normal in size without focal abnormality. Adrenals/Urinary Tract: The adrenal glands unremarkable. There is no hydronephrosis or nephrolithiasis on either side. The visualized ureters and urinary bladder appear unremarkable. Stomach/Bowel: There is no bowel obstruction or active inflammation. The appendix is normal. Vascular/Lymphatic: The abdominal aorta and IVC are unremarkable. No portal venous gas. There is no adenopathy. Reproductive: The uterus is retroverted.  No adnexal masses. Other: None Musculoskeletal: No acute or significant osseous findings. IMPRESSION: No acute intra-abdominal or pelvic pathology. No hydronephrosis or nephrolithiasis. Electronically Signed   By: Elgie Collard M.D.   On: 09/05/2020 01:39    Procedures Procedures   Medications Ordered in ED Medications  ketorolac (TORADOL) 30 MG/ML injection 30 mg (has no administration in time range)  haloperidol lactate (HALDOL) injection 2 mg (2 mg Intravenous Given 09/05/20 0104)  sodium chloride 0.9 % bolus 500 mL (500 mLs Intravenous Bolus from Bag 09/05/20 0154)  haloperidol lactate (HALDOL) injection 3 mg (3 mg  Intravenous Given 09/05/20 0222)    ED Course  I have reviewed the triage vital signs and the nursing notes.  Pertinent labs & imaging results that were available during my care of the patient were reviewed by me and considered in my medical decision making (see chart for details).    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  CT is negative.  Urine has been cultured.  White count is due to demargination from excessive vomiting.  PO challenged successfully.  Stable for discharge with close follow up.    Patricia Whitehead was evaluated in Emergency Department on 09/05/2020 for the symptoms described in the history of present illness. She was evaluated in the context of the global COVID-19 pandemic, which necessitated consideration that the patient might be at risk for infection with the SARS-CoV-2 virus that causes COVID-19. Institutional protocols and algorithms that pertain to the evaluation of patients at risk for COVID-19 are in a state of rapid change based on information released by regulatory bodies including the CDC and federal and state organizations. These policies and algorithms were followed during the patient's care in the ED.  Final Clinical Impression(s) / ED Diagnoses Return for intractable cough, coughing up blood, fevers >100.4 unrelieved by medication, shortness of breath, intractable vomiting, chest pain, shortness of breath, weakness, numbness, changes in speech, facial asymmetry, abdominal pain, passing out, Inability to tolerate liquids or food, cough, altered mental status or any concerns. No signs of  systemic illness or infection. The patient is nontoxic-appearing on exam and vital signs are within normal limits.  I have reviewed the triage vital signs and the nursing notes. Pertinent labs & imaging results that were available during my care of the patient were reviewed by me and considered in my medical decision making (see chart for details). After history, exam, and medical  workup I feel the patient has been appropriately medically screened and is safe for discharge home. Pertinent diagnoses were discussed with the patient. Patient was given return precautions.    Chynna Buerkle, MD 09/05/20 0932

## 2020-09-05 NOTE — ED Notes (Addendum)
Pt was able to rest comfortably and fall asleep. Pain rated 2/10 with mild nausea. Pt verbalized understanding of d/c, medication, and follow up care. Ambulatory with steady gait.

## 2020-09-05 NOTE — ED Notes (Signed)
Pt reports she dropped specimen cup in toilet.

## 2020-09-05 NOTE — ED Notes (Signed)
Pt gown and linen changed. Pt ambulated to restroom with steady gait

## 2020-09-06 LAB — URINE CULTURE

## 2020-10-15 DIAGNOSIS — F418 Other specified anxiety disorders: Secondary | ICD-10-CM | POA: Diagnosis not present

## 2020-10-15 DIAGNOSIS — I10 Essential (primary) hypertension: Secondary | ICD-10-CM | POA: Diagnosis not present

## 2020-10-15 DIAGNOSIS — Z131 Encounter for screening for diabetes mellitus: Secondary | ICD-10-CM | POA: Diagnosis not present

## 2020-10-15 DIAGNOSIS — J301 Allergic rhinitis due to pollen: Secondary | ICD-10-CM | POA: Diagnosis not present

## 2020-10-15 DIAGNOSIS — K219 Gastro-esophageal reflux disease without esophagitis: Secondary | ICD-10-CM | POA: Diagnosis not present

## 2020-10-15 DIAGNOSIS — Z789 Other specified health status: Secondary | ICD-10-CM | POA: Diagnosis not present

## 2020-10-15 DIAGNOSIS — Z0000 Encounter for general adult medical examination without abnormal findings: Secondary | ICD-10-CM | POA: Diagnosis not present

## 2020-12-09 DIAGNOSIS — B009 Herpesviral infection, unspecified: Secondary | ICD-10-CM | POA: Diagnosis not present

## 2020-12-09 DIAGNOSIS — Z789 Other specified health status: Secondary | ICD-10-CM | POA: Diagnosis not present

## 2020-12-09 DIAGNOSIS — F418 Other specified anxiety disorders: Secondary | ICD-10-CM | POA: Diagnosis not present

## 2020-12-09 DIAGNOSIS — I8393 Asymptomatic varicose veins of bilateral lower extremities: Secondary | ICD-10-CM | POA: Diagnosis not present

## 2020-12-09 DIAGNOSIS — I10 Essential (primary) hypertension: Secondary | ICD-10-CM | POA: Diagnosis not present

## 2020-12-09 DIAGNOSIS — J301 Allergic rhinitis due to pollen: Secondary | ICD-10-CM | POA: Diagnosis not present

## 2020-12-09 DIAGNOSIS — K219 Gastro-esophageal reflux disease without esophagitis: Secondary | ICD-10-CM | POA: Diagnosis not present

## 2021-01-02 DIAGNOSIS — B009 Herpesviral infection, unspecified: Secondary | ICD-10-CM | POA: Diagnosis not present

## 2021-01-02 DIAGNOSIS — F418 Other specified anxiety disorders: Secondary | ICD-10-CM | POA: Diagnosis not present

## 2021-01-02 DIAGNOSIS — Z789 Other specified health status: Secondary | ICD-10-CM | POA: Diagnosis not present

## 2021-01-02 DIAGNOSIS — I10 Essential (primary) hypertension: Secondary | ICD-10-CM | POA: Diagnosis not present

## 2021-01-02 DIAGNOSIS — J301 Allergic rhinitis due to pollen: Secondary | ICD-10-CM | POA: Diagnosis not present

## 2021-01-02 DIAGNOSIS — K219 Gastro-esophageal reflux disease without esophagitis: Secondary | ICD-10-CM | POA: Diagnosis not present

## 2021-01-02 DIAGNOSIS — I8393 Asymptomatic varicose veins of bilateral lower extremities: Secondary | ICD-10-CM | POA: Diagnosis not present

## 2021-01-23 DIAGNOSIS — K219 Gastro-esophageal reflux disease without esophagitis: Secondary | ICD-10-CM | POA: Diagnosis not present

## 2021-01-23 DIAGNOSIS — Z789 Other specified health status: Secondary | ICD-10-CM | POA: Diagnosis not present

## 2021-01-23 DIAGNOSIS — R300 Dysuria: Secondary | ICD-10-CM | POA: Diagnosis not present

## 2021-01-23 DIAGNOSIS — F418 Other specified anxiety disorders: Secondary | ICD-10-CM | POA: Diagnosis not present

## 2021-01-23 DIAGNOSIS — B009 Herpesviral infection, unspecified: Secondary | ICD-10-CM | POA: Diagnosis not present

## 2021-01-23 DIAGNOSIS — J301 Allergic rhinitis due to pollen: Secondary | ICD-10-CM | POA: Diagnosis not present

## 2021-01-23 DIAGNOSIS — I8393 Asymptomatic varicose veins of bilateral lower extremities: Secondary | ICD-10-CM | POA: Diagnosis not present

## 2021-01-23 DIAGNOSIS — I10 Essential (primary) hypertension: Secondary | ICD-10-CM | POA: Diagnosis not present

## 2021-02-06 DIAGNOSIS — F418 Other specified anxiety disorders: Secondary | ICD-10-CM | POA: Diagnosis not present

## 2021-02-06 DIAGNOSIS — I8393 Asymptomatic varicose veins of bilateral lower extremities: Secondary | ICD-10-CM | POA: Diagnosis not present

## 2021-02-06 DIAGNOSIS — K219 Gastro-esophageal reflux disease without esophagitis: Secondary | ICD-10-CM | POA: Diagnosis not present

## 2021-02-06 DIAGNOSIS — I10 Essential (primary) hypertension: Secondary | ICD-10-CM | POA: Diagnosis not present

## 2021-02-06 DIAGNOSIS — B009 Herpesviral infection, unspecified: Secondary | ICD-10-CM | POA: Diagnosis not present

## 2021-02-06 DIAGNOSIS — Z124 Encounter for screening for malignant neoplasm of cervix: Secondary | ICD-10-CM | POA: Diagnosis not present

## 2021-02-06 DIAGNOSIS — Z789 Other specified health status: Secondary | ICD-10-CM | POA: Diagnosis not present

## 2021-02-06 DIAGNOSIS — J301 Allergic rhinitis due to pollen: Secondary | ICD-10-CM | POA: Diagnosis not present

## 2021-02-13 DIAGNOSIS — I83819 Varicose veins of unspecified lower extremities with pain: Secondary | ICD-10-CM | POA: Diagnosis not present

## 2021-03-17 DIAGNOSIS — I83813 Varicose veins of bilateral lower extremities with pain: Secondary | ICD-10-CM | POA: Diagnosis not present

## 2021-04-29 DIAGNOSIS — S39012A Strain of muscle, fascia and tendon of lower back, initial encounter: Secondary | ICD-10-CM | POA: Diagnosis not present

## 2021-05-14 DIAGNOSIS — J3081 Allergic rhinitis due to animal (cat) (dog) hair and dander: Secondary | ICD-10-CM | POA: Diagnosis not present

## 2021-05-14 DIAGNOSIS — J3089 Other allergic rhinitis: Secondary | ICD-10-CM | POA: Diagnosis not present

## 2021-05-14 DIAGNOSIS — B999 Unspecified infectious disease: Secondary | ICD-10-CM | POA: Diagnosis not present

## 2021-05-14 DIAGNOSIS — J301 Allergic rhinitis due to pollen: Secondary | ICD-10-CM | POA: Diagnosis not present

## 2021-07-14 DIAGNOSIS — F418 Other specified anxiety disorders: Secondary | ICD-10-CM | POA: Diagnosis not present

## 2021-07-14 DIAGNOSIS — R300 Dysuria: Secondary | ICD-10-CM | POA: Diagnosis not present

## 2021-07-14 DIAGNOSIS — J301 Allergic rhinitis due to pollen: Secondary | ICD-10-CM | POA: Diagnosis not present

## 2021-07-14 DIAGNOSIS — I8393 Asymptomatic varicose veins of bilateral lower extremities: Secondary | ICD-10-CM | POA: Diagnosis not present

## 2021-07-14 DIAGNOSIS — I10 Essential (primary) hypertension: Secondary | ICD-10-CM | POA: Diagnosis not present

## 2021-07-14 DIAGNOSIS — K219 Gastro-esophageal reflux disease without esophagitis: Secondary | ICD-10-CM | POA: Diagnosis not present

## 2021-07-14 DIAGNOSIS — Z789 Other specified health status: Secondary | ICD-10-CM | POA: Diagnosis not present

## 2021-08-23 DIAGNOSIS — H5213 Myopia, bilateral: Secondary | ICD-10-CM | POA: Diagnosis not present

## 2021-09-04 DIAGNOSIS — M5451 Vertebrogenic low back pain: Secondary | ICD-10-CM | POA: Diagnosis not present

## 2021-11-21 DIAGNOSIS — Z0000 Encounter for general adult medical examination without abnormal findings: Secondary | ICD-10-CM | POA: Diagnosis not present

## 2021-11-21 DIAGNOSIS — K219 Gastro-esophageal reflux disease without esophagitis: Secondary | ICD-10-CM | POA: Diagnosis not present

## 2021-11-21 DIAGNOSIS — I10 Essential (primary) hypertension: Secondary | ICD-10-CM | POA: Diagnosis not present

## 2021-11-21 DIAGNOSIS — Z789 Other specified health status: Secondary | ICD-10-CM | POA: Diagnosis not present

## 2021-11-21 DIAGNOSIS — I8393 Asymptomatic varicose veins of bilateral lower extremities: Secondary | ICD-10-CM | POA: Diagnosis not present

## 2021-11-21 DIAGNOSIS — F418 Other specified anxiety disorders: Secondary | ICD-10-CM | POA: Diagnosis not present

## 2021-11-21 DIAGNOSIS — J301 Allergic rhinitis due to pollen: Secondary | ICD-10-CM | POA: Diagnosis not present

## 2022-02-25 DIAGNOSIS — K219 Gastro-esophageal reflux disease without esophagitis: Secondary | ICD-10-CM | POA: Diagnosis not present

## 2022-02-25 DIAGNOSIS — I10 Essential (primary) hypertension: Secondary | ICD-10-CM | POA: Diagnosis not present

## 2022-02-25 DIAGNOSIS — I8393 Asymptomatic varicose veins of bilateral lower extremities: Secondary | ICD-10-CM | POA: Diagnosis not present

## 2022-02-25 DIAGNOSIS — Z789 Other specified health status: Secondary | ICD-10-CM | POA: Diagnosis not present

## 2022-02-25 DIAGNOSIS — F418 Other specified anxiety disorders: Secondary | ICD-10-CM | POA: Diagnosis not present

## 2022-02-25 DIAGNOSIS — J301 Allergic rhinitis due to pollen: Secondary | ICD-10-CM | POA: Diagnosis not present

## 2022-05-26 DIAGNOSIS — F418 Other specified anxiety disorders: Secondary | ICD-10-CM | POA: Diagnosis not present

## 2022-05-26 DIAGNOSIS — K219 Gastro-esophageal reflux disease without esophagitis: Secondary | ICD-10-CM | POA: Diagnosis not present

## 2022-05-26 DIAGNOSIS — J301 Allergic rhinitis due to pollen: Secondary | ICD-10-CM | POA: Diagnosis not present

## 2022-05-26 DIAGNOSIS — I10 Essential (primary) hypertension: Secondary | ICD-10-CM | POA: Diagnosis not present

## 2022-07-02 DIAGNOSIS — J22 Unspecified acute lower respiratory infection: Secondary | ICD-10-CM | POA: Diagnosis not present

## 2022-08-10 ENCOUNTER — Telehealth: Payer: Self-pay | Admitting: Nurse Practitioner

## 2022-08-10 NOTE — Telephone Encounter (Signed)
LVM for patient to call back to schedule apt with PCP. AS, CMA 

## 2022-08-25 DIAGNOSIS — K219 Gastro-esophageal reflux disease without esophagitis: Secondary | ICD-10-CM | POA: Diagnosis not present

## 2022-08-25 DIAGNOSIS — J301 Allergic rhinitis due to pollen: Secondary | ICD-10-CM | POA: Diagnosis not present

## 2022-08-25 DIAGNOSIS — Z113 Encounter for screening for infections with a predominantly sexual mode of transmission: Secondary | ICD-10-CM | POA: Diagnosis not present

## 2022-08-25 DIAGNOSIS — Z0000 Encounter for general adult medical examination without abnormal findings: Secondary | ICD-10-CM | POA: Diagnosis not present

## 2022-08-25 DIAGNOSIS — F418 Other specified anxiety disorders: Secondary | ICD-10-CM | POA: Diagnosis not present

## 2022-08-25 DIAGNOSIS — I10 Essential (primary) hypertension: Secondary | ICD-10-CM | POA: Diagnosis not present

## 2022-08-25 DIAGNOSIS — Z131 Encounter for screening for diabetes mellitus: Secondary | ICD-10-CM | POA: Diagnosis not present

## 2022-10-29 DIAGNOSIS — E876 Hypokalemia: Secondary | ICD-10-CM | POA: Diagnosis not present

## 2022-10-29 DIAGNOSIS — R112 Nausea with vomiting, unspecified: Secondary | ICD-10-CM | POA: Diagnosis not present

## 2022-10-29 DIAGNOSIS — M722 Plantar fascial fibromatosis: Secondary | ICD-10-CM | POA: Diagnosis not present

## 2022-10-29 DIAGNOSIS — M79672 Pain in left foot: Secondary | ICD-10-CM | POA: Diagnosis not present

## 2022-10-29 DIAGNOSIS — M79605 Pain in left leg: Secondary | ICD-10-CM | POA: Diagnosis not present

## 2022-11-02 DIAGNOSIS — M79672 Pain in left foot: Secondary | ICD-10-CM | POA: Diagnosis not present

## 2022-11-05 DIAGNOSIS — Z3041 Encounter for surveillance of contraceptive pills: Secondary | ICD-10-CM | POA: Diagnosis not present

## 2022-11-05 DIAGNOSIS — A084 Viral intestinal infection, unspecified: Secondary | ICD-10-CM | POA: Diagnosis not present

## 2022-11-10 DIAGNOSIS — F410 Panic disorder [episodic paroxysmal anxiety] without agoraphobia: Secondary | ICD-10-CM | POA: Diagnosis not present

## 2022-11-10 DIAGNOSIS — F411 Generalized anxiety disorder: Secondary | ICD-10-CM | POA: Diagnosis not present

## 2022-11-23 DIAGNOSIS — K581 Irritable bowel syndrome with constipation: Secondary | ICD-10-CM | POA: Diagnosis not present

## 2022-11-23 DIAGNOSIS — F411 Generalized anxiety disorder: Secondary | ICD-10-CM | POA: Diagnosis not present

## 2022-11-23 DIAGNOSIS — M722 Plantar fascial fibromatosis: Secondary | ICD-10-CM | POA: Diagnosis not present

## 2022-11-23 DIAGNOSIS — L700 Acne vulgaris: Secondary | ICD-10-CM | POA: Diagnosis not present

## 2022-11-23 DIAGNOSIS — I10 Essential (primary) hypertension: Secondary | ICD-10-CM | POA: Diagnosis not present

## 2022-11-23 DIAGNOSIS — F410 Panic disorder [episodic paroxysmal anxiety] without agoraphobia: Secondary | ICD-10-CM | POA: Diagnosis not present

## 2022-12-03 IMAGING — CT CT RENAL STONE PROTOCOL
2 of 4 series · 16 of 46 positions shown, 18 images · non-contrast
Comparison: CT abdomen pelvis dated 06/02/2015.

CLINICAL DATA: 34-year-old female with flank pain. Concern for
kidney stone.

EXAM:
CT ABDOMEN AND PELVIS WITHOUT CONTRAST
TECHNIQUE: Multidetector CT imaging of the abdomen and pelvis was performed
following the standard protocol without IV contrast.

[Series 2: axial st · axial · 0.64mm/px · z∈[+1118,+1488]mm · 13 of 82 slices shown, 15 images]
[im 4/82  soft-tissue]
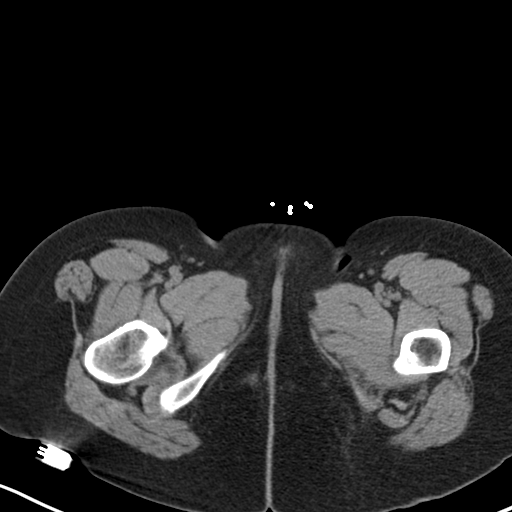
[im 4/82  bone]
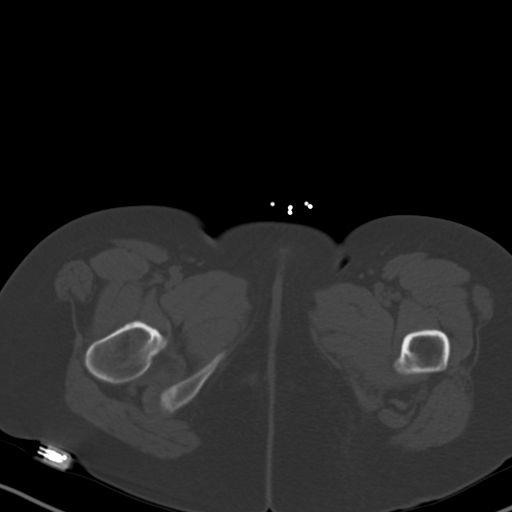
[im 12/82  soft-tissue]
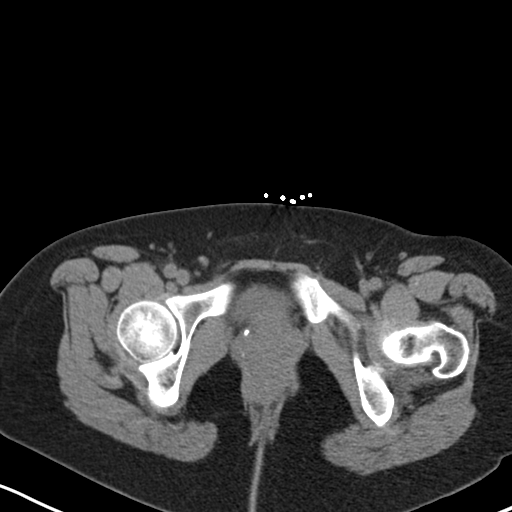
[im 16/82  soft-tissue]
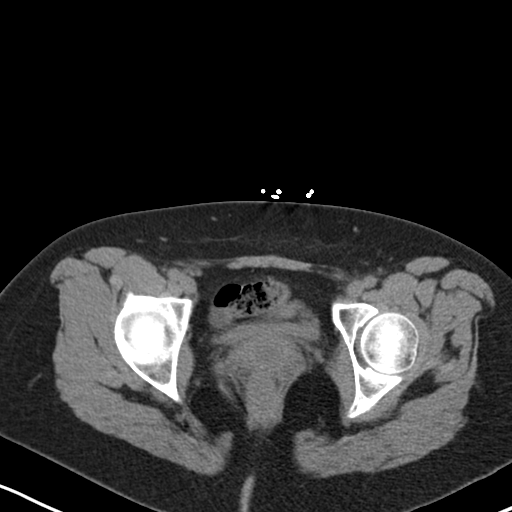
[im 24/82  soft-tissue]
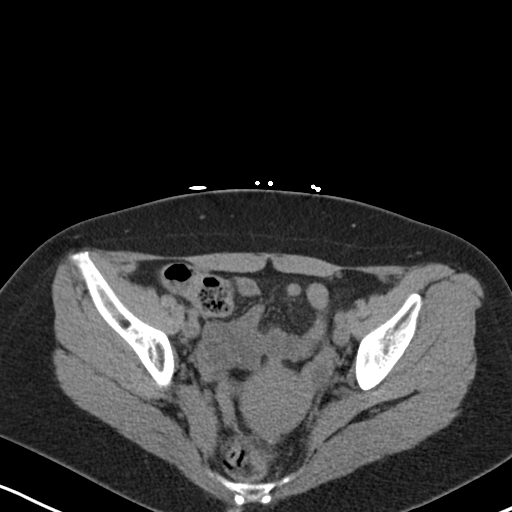
[im 28/82  soft-tissue]
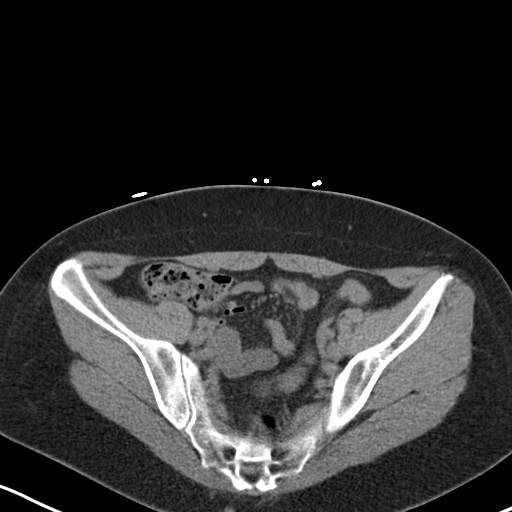
[im 35/82  soft-tissue]
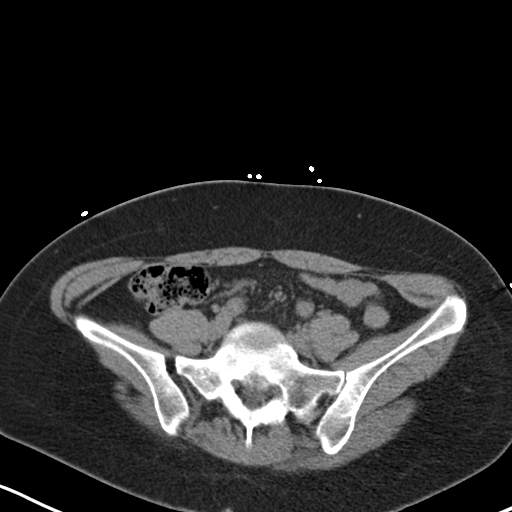
[im 43/82  soft-tissue]
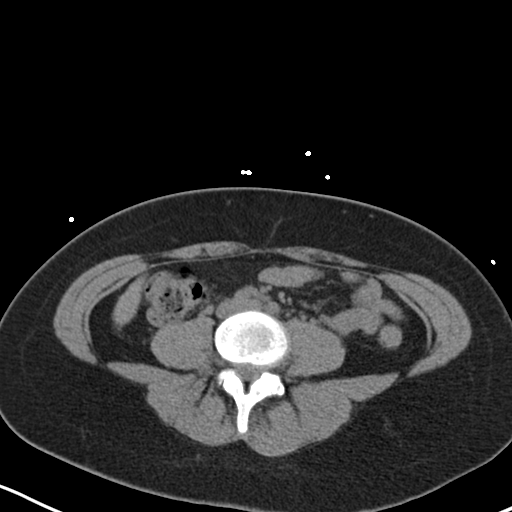
[im 47/82  soft-tissue]
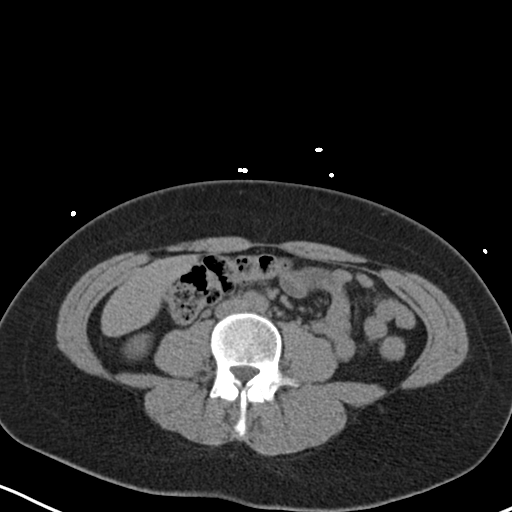
[im 55/82  soft-tissue]
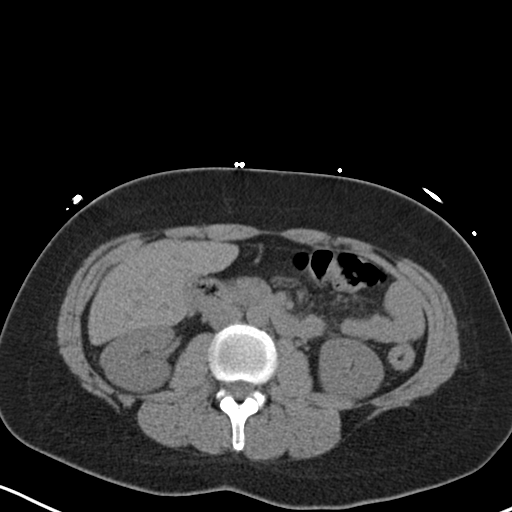
[im 55/82  bone]
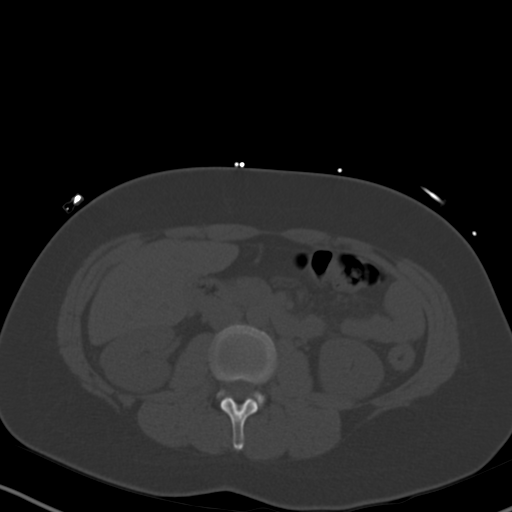
[im 58/82  soft-tissue]
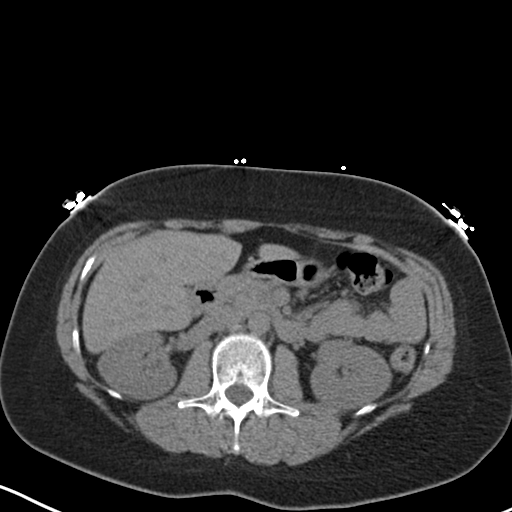
[im 66/82  soft-tissue]
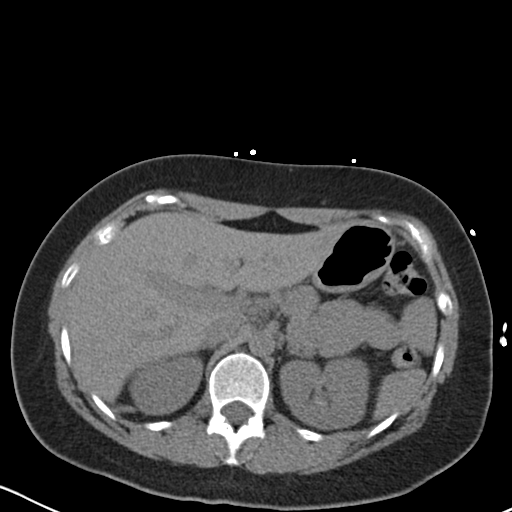
[im 70/82  soft-tissue]
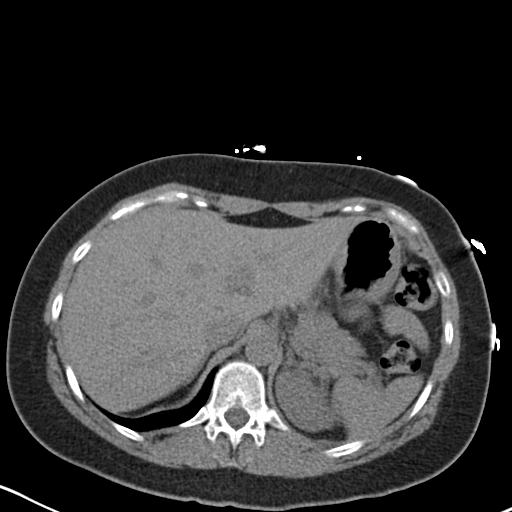
[im 78/82  soft-tissue]
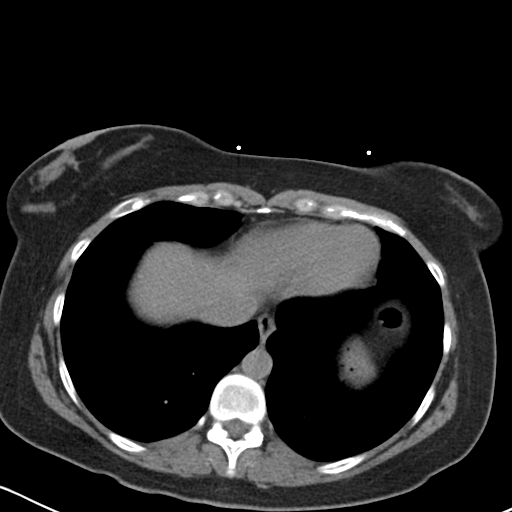

[Series 4: coronal · coronal · 0.65mm/px · 3 of 111 slices shown]
[im 37/111  soft-tissue]
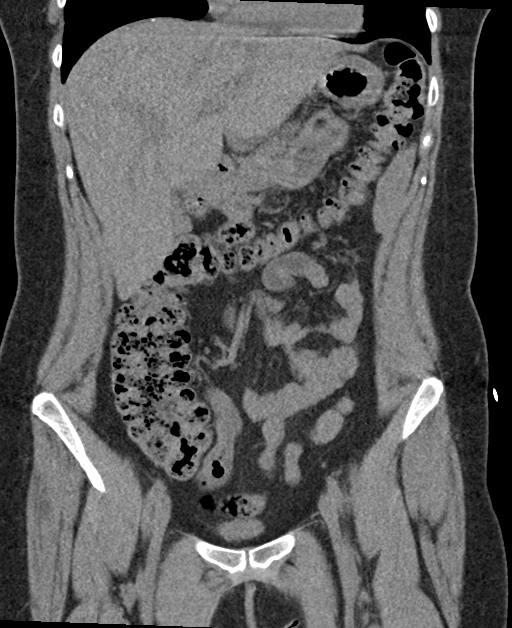
[im 49/111  soft-tissue]
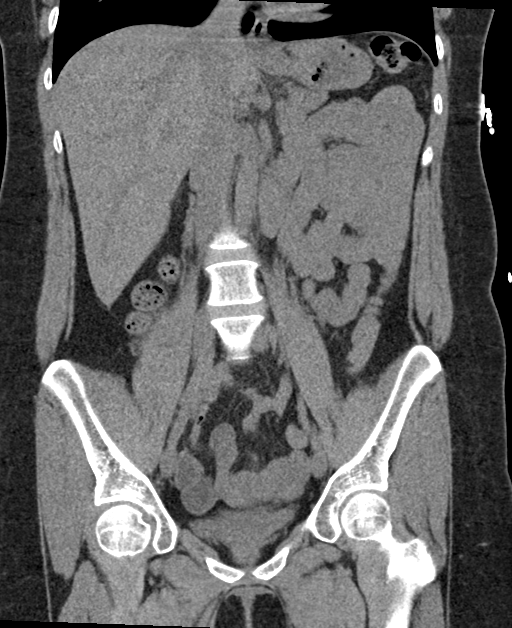
[im 62/111  soft-tissue]
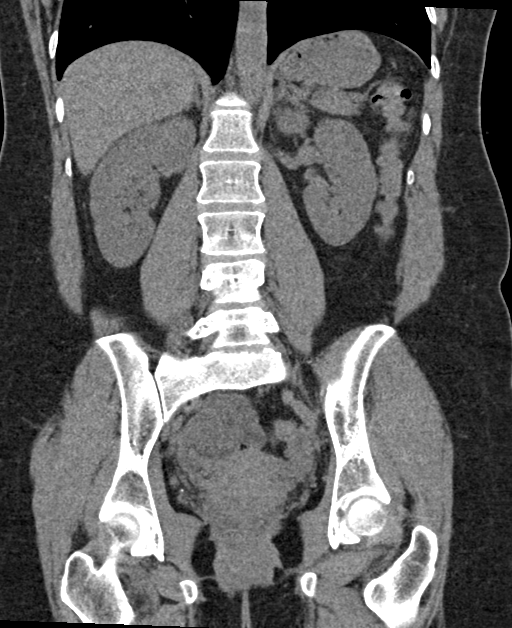

[16 of 46 positions shown; findings below may reference images not displayed]

FINDINGS: Evaluation of this exam is limited in the absence of intravenous
contrast.

Lower chest: The visualized lung bases are clear.

No intra-abdominal free air or free fluid.

Hepatobiliary: The liver is unremarkable. No intrahepatic biliary
dilatation. The gallbladder is contracted. No calcified gallstone.

Pancreas: Unremarkable. No pancreatic ductal dilatation or
surrounding inflammatory changes.

Spleen: Normal in size without focal abnormality.

Adrenals/Urinary Tract: The adrenal glands unremarkable. There is no
hydronephrosis or nephrolithiasis on either side. The visualized
ureters and urinary bladder appear unremarkable.

Stomach/Bowel: There is no bowel obstruction or active inflammation.
The appendix is normal.

Vascular/Lymphatic: The abdominal aorta and IVC are unremarkable. No
portal venous gas. There is no adenopathy.

Reproductive: The uterus is retroverted.  No adnexal masses.

Other: None

Musculoskeletal: No acute or significant osseous findings.
IMPRESSION: No acute intra-abdominal or pelvic pathology. No hydronephrosis or
nephrolithiasis.

## 2022-12-11 DIAGNOSIS — F410 Panic disorder [episodic paroxysmal anxiety] without agoraphobia: Secondary | ICD-10-CM | POA: Diagnosis not present

## 2022-12-11 DIAGNOSIS — F411 Generalized anxiety disorder: Secondary | ICD-10-CM | POA: Diagnosis not present

## 2022-12-11 DIAGNOSIS — B001 Herpesviral vesicular dermatitis: Secondary | ICD-10-CM | POA: Diagnosis not present

## 2022-12-21 DIAGNOSIS — M545 Low back pain, unspecified: Secondary | ICD-10-CM | POA: Diagnosis not present

## 2023-03-09 DIAGNOSIS — K581 Irritable bowel syndrome with constipation: Secondary | ICD-10-CM | POA: Diagnosis not present

## 2023-03-09 DIAGNOSIS — J019 Acute sinusitis, unspecified: Secondary | ICD-10-CM | POA: Diagnosis not present

## 2023-03-11 DIAGNOSIS — K5900 Constipation, unspecified: Secondary | ICD-10-CM | POA: Diagnosis not present

## 2023-03-19 DIAGNOSIS — J3489 Other specified disorders of nose and nasal sinuses: Secondary | ICD-10-CM | POA: Diagnosis not present

## 2023-03-19 DIAGNOSIS — R197 Diarrhea, unspecified: Secondary | ICD-10-CM | POA: Diagnosis not present

## 2023-03-23 DIAGNOSIS — R197 Diarrhea, unspecified: Secondary | ICD-10-CM | POA: Diagnosis not present

## 2023-08-04 DIAGNOSIS — I10 Essential (primary) hypertension: Secondary | ICD-10-CM | POA: Diagnosis not present

## 2023-08-04 DIAGNOSIS — J301 Allergic rhinitis due to pollen: Secondary | ICD-10-CM | POA: Diagnosis not present

## 2023-08-04 DIAGNOSIS — R0982 Postnasal drip: Secondary | ICD-10-CM | POA: Diagnosis not present

## 2023-10-11 DIAGNOSIS — J3081 Allergic rhinitis due to animal (cat) (dog) hair and dander: Secondary | ICD-10-CM | POA: Diagnosis not present

## 2023-10-11 DIAGNOSIS — J3089 Other allergic rhinitis: Secondary | ICD-10-CM | POA: Diagnosis not present

## 2023-10-26 DIAGNOSIS — J3089 Other allergic rhinitis: Secondary | ICD-10-CM | POA: Diagnosis not present

## 2023-10-26 DIAGNOSIS — J3081 Allergic rhinitis due to animal (cat) (dog) hair and dander: Secondary | ICD-10-CM | POA: Diagnosis not present

## 2023-11-09 DIAGNOSIS — I10 Essential (primary) hypertension: Secondary | ICD-10-CM | POA: Diagnosis not present

## 2023-11-09 DIAGNOSIS — Z3041 Encounter for surveillance of contraceptive pills: Secondary | ICD-10-CM | POA: Diagnosis not present

## 2023-11-09 DIAGNOSIS — F410 Panic disorder [episodic paroxysmal anxiety] without agoraphobia: Secondary | ICD-10-CM | POA: Diagnosis not present

## 2023-11-09 DIAGNOSIS — R110 Nausea: Secondary | ICD-10-CM | POA: Diagnosis not present

## 2023-11-09 DIAGNOSIS — F411 Generalized anxiety disorder: Secondary | ICD-10-CM | POA: Diagnosis not present

## 2023-12-01 DIAGNOSIS — M545 Low back pain, unspecified: Secondary | ICD-10-CM | POA: Diagnosis not present

## 2023-12-07 DIAGNOSIS — J3081 Allergic rhinitis due to animal (cat) (dog) hair and dander: Secondary | ICD-10-CM | POA: Diagnosis not present

## 2023-12-07 DIAGNOSIS — J3089 Other allergic rhinitis: Secondary | ICD-10-CM | POA: Diagnosis not present

## 2023-12-15 DIAGNOSIS — J3081 Allergic rhinitis due to animal (cat) (dog) hair and dander: Secondary | ICD-10-CM | POA: Diagnosis not present

## 2023-12-15 DIAGNOSIS — J3089 Other allergic rhinitis: Secondary | ICD-10-CM | POA: Diagnosis not present

## 2023-12-21 DIAGNOSIS — J3089 Other allergic rhinitis: Secondary | ICD-10-CM | POA: Diagnosis not present

## 2023-12-21 DIAGNOSIS — J3081 Allergic rhinitis due to animal (cat) (dog) hair and dander: Secondary | ICD-10-CM | POA: Diagnosis not present

## 2023-12-28 DIAGNOSIS — R8290 Unspecified abnormal findings in urine: Secondary | ICD-10-CM | POA: Diagnosis not present

## 2023-12-28 DIAGNOSIS — R200 Anesthesia of skin: Secondary | ICD-10-CM | POA: Diagnosis not present

## 2023-12-28 DIAGNOSIS — I10 Essential (primary) hypertension: Secondary | ICD-10-CM | POA: Diagnosis not present

## 2023-12-28 DIAGNOSIS — R1011 Right upper quadrant pain: Secondary | ICD-10-CM | POA: Diagnosis not present

## 2023-12-28 DIAGNOSIS — R202 Paresthesia of skin: Secondary | ICD-10-CM | POA: Diagnosis not present

## 2024-01-27 DIAGNOSIS — Z8379 Family history of other diseases of the digestive system: Secondary | ICD-10-CM | POA: Diagnosis not present

## 2024-01-27 DIAGNOSIS — R8290 Unspecified abnormal findings in urine: Secondary | ICD-10-CM | POA: Diagnosis not present

## 2024-01-27 DIAGNOSIS — R109 Unspecified abdominal pain: Secondary | ICD-10-CM | POA: Diagnosis not present

## 2024-01-27 DIAGNOSIS — R110 Nausea: Secondary | ICD-10-CM | POA: Diagnosis not present

## 2024-01-27 DIAGNOSIS — R1011 Right upper quadrant pain: Secondary | ICD-10-CM | POA: Diagnosis not present

## 2024-01-27 DIAGNOSIS — K8050 Calculus of bile duct without cholangitis or cholecystitis without obstruction: Secondary | ICD-10-CM | POA: Diagnosis not present

## 2024-01-28 DIAGNOSIS — K8020 Calculus of gallbladder without cholecystitis without obstruction: Secondary | ICD-10-CM | POA: Diagnosis not present

## 2024-01-28 DIAGNOSIS — I10 Essential (primary) hypertension: Secondary | ICD-10-CM | POA: Diagnosis not present

## 2024-02-07 DIAGNOSIS — K811 Chronic cholecystitis: Secondary | ICD-10-CM | POA: Diagnosis not present

## 2024-02-07 DIAGNOSIS — K8010 Calculus of gallbladder with chronic cholecystitis without obstruction: Secondary | ICD-10-CM | POA: Diagnosis not present

## 2024-02-07 DIAGNOSIS — I10 Essential (primary) hypertension: Secondary | ICD-10-CM | POA: Diagnosis not present

## 2024-02-26 DIAGNOSIS — R1013 Epigastric pain: Secondary | ICD-10-CM | POA: Diagnosis not present

## 2024-02-26 DIAGNOSIS — R112 Nausea with vomiting, unspecified: Secondary | ICD-10-CM | POA: Diagnosis not present

## 2024-02-26 DIAGNOSIS — R509 Fever, unspecified: Secondary | ICD-10-CM | POA: Diagnosis not present

## 2024-02-26 DIAGNOSIS — R110 Nausea: Secondary | ICD-10-CM | POA: Diagnosis not present

## 2024-02-26 DIAGNOSIS — Z9049 Acquired absence of other specified parts of digestive tract: Secondary | ICD-10-CM | POA: Diagnosis not present

## 2024-02-29 DIAGNOSIS — I10 Essential (primary) hypertension: Secondary | ICD-10-CM | POA: Diagnosis not present

## 2024-02-29 DIAGNOSIS — R197 Diarrhea, unspecified: Secondary | ICD-10-CM | POA: Diagnosis not present

## 2024-02-29 DIAGNOSIS — R112 Nausea with vomiting, unspecified: Secondary | ICD-10-CM | POA: Diagnosis not present

## 2024-03-03 DIAGNOSIS — R112 Nausea with vomiting, unspecified: Secondary | ICD-10-CM | POA: Diagnosis not present

## 2024-03-03 DIAGNOSIS — I10 Essential (primary) hypertension: Secondary | ICD-10-CM | POA: Diagnosis not present

## 2024-03-03 DIAGNOSIS — K5900 Constipation, unspecified: Secondary | ICD-10-CM | POA: Diagnosis not present

## 2024-03-18 DIAGNOSIS — K5900 Constipation, unspecified: Secondary | ICD-10-CM | POA: Diagnosis not present

## 2024-03-18 DIAGNOSIS — R0902 Hypoxemia: Secondary | ICD-10-CM | POA: Diagnosis not present

## 2024-03-18 DIAGNOSIS — R1084 Generalized abdominal pain: Secondary | ICD-10-CM | POA: Diagnosis not present

## 2024-03-18 DIAGNOSIS — R079 Chest pain, unspecified: Secondary | ICD-10-CM | POA: Diagnosis not present
